# Patient Record
Sex: Female | Born: 1940 | Race: Black or African American | Hispanic: No | Smoking: Never smoker
Health system: Southern US, Community
[De-identification: ages and names within clinical notes are randomized; demographics above are authoritative.]

## PROBLEM LIST (undated history)

## (undated) DIAGNOSIS — N189 Chronic kidney disease, unspecified: Secondary | ICD-10-CM

## (undated) DIAGNOSIS — E039 Hypothyroidism, unspecified: Secondary | ICD-10-CM

## (undated) DIAGNOSIS — M1990 Unspecified osteoarthritis, unspecified site: Secondary | ICD-10-CM

## (undated) DIAGNOSIS — I10 Essential (primary) hypertension: Secondary | ICD-10-CM

## (undated) DIAGNOSIS — E7800 Pure hypercholesterolemia, unspecified: Secondary | ICD-10-CM

## (undated) HISTORY — PX: NO PAST SURGERIES: SHX2092

---

## 2015-06-24 DIAGNOSIS — Z789 Other specified health status: Secondary | ICD-10-CM | POA: Diagnosis not present

## 2015-06-24 DIAGNOSIS — I10 Essential (primary) hypertension: Secondary | ICD-10-CM | POA: Diagnosis not present

## 2015-06-24 DIAGNOSIS — J069 Acute upper respiratory infection, unspecified: Secondary | ICD-10-CM | POA: Diagnosis not present

## 2015-06-24 DIAGNOSIS — N184 Chronic kidney disease, stage 4 (severe): Secondary | ICD-10-CM | POA: Diagnosis not present

## 2015-08-27 DIAGNOSIS — R5383 Other fatigue: Secondary | ICD-10-CM | POA: Diagnosis not present

## 2015-08-27 DIAGNOSIS — Z1211 Encounter for screening for malignant neoplasm of colon: Secondary | ICD-10-CM | POA: Diagnosis not present

## 2015-08-27 DIAGNOSIS — E7800 Pure hypercholesterolemia, unspecified: Secondary | ICD-10-CM | POA: Diagnosis not present

## 2015-08-27 DIAGNOSIS — Z7189 Other specified counseling: Secondary | ICD-10-CM | POA: Diagnosis not present

## 2015-08-27 DIAGNOSIS — Z1389 Encounter for screening for other disorder: Secondary | ICD-10-CM | POA: Diagnosis not present

## 2015-08-27 DIAGNOSIS — Z299 Encounter for prophylactic measures, unspecified: Secondary | ICD-10-CM | POA: Diagnosis not present

## 2015-08-27 DIAGNOSIS — Z79899 Other long term (current) drug therapy: Secondary | ICD-10-CM | POA: Diagnosis not present

## 2015-08-27 DIAGNOSIS — Z0000 Encounter for general adult medical examination without abnormal findings: Secondary | ICD-10-CM | POA: Diagnosis not present

## 2015-09-22 DIAGNOSIS — E7800 Pure hypercholesterolemia, unspecified: Secondary | ICD-10-CM | POA: Diagnosis not present

## 2015-09-22 DIAGNOSIS — I10 Essential (primary) hypertension: Secondary | ICD-10-CM | POA: Diagnosis not present

## 2015-10-15 DIAGNOSIS — R809 Proteinuria, unspecified: Secondary | ICD-10-CM | POA: Diagnosis not present

## 2015-10-15 DIAGNOSIS — Z79899 Other long term (current) drug therapy: Secondary | ICD-10-CM | POA: Diagnosis not present

## 2015-10-15 DIAGNOSIS — I10 Essential (primary) hypertension: Secondary | ICD-10-CM | POA: Diagnosis not present

## 2015-10-15 DIAGNOSIS — D509 Iron deficiency anemia, unspecified: Secondary | ICD-10-CM | POA: Diagnosis not present

## 2015-10-15 DIAGNOSIS — N183 Chronic kidney disease, stage 3 (moderate): Secondary | ICD-10-CM | POA: Diagnosis not present

## 2015-10-15 DIAGNOSIS — E559 Vitamin D deficiency, unspecified: Secondary | ICD-10-CM | POA: Diagnosis not present

## 2015-10-23 DIAGNOSIS — E7800 Pure hypercholesterolemia, unspecified: Secondary | ICD-10-CM | POA: Diagnosis not present

## 2015-10-23 DIAGNOSIS — I10 Essential (primary) hypertension: Secondary | ICD-10-CM | POA: Diagnosis not present

## 2015-10-28 DIAGNOSIS — I10 Essential (primary) hypertension: Secondary | ICD-10-CM | POA: Diagnosis not present

## 2015-10-28 DIAGNOSIS — D509 Iron deficiency anemia, unspecified: Secondary | ICD-10-CM | POA: Diagnosis not present

## 2015-10-28 DIAGNOSIS — R809 Proteinuria, unspecified: Secondary | ICD-10-CM | POA: Diagnosis not present

## 2015-10-28 DIAGNOSIS — N183 Chronic kidney disease, stage 3 (moderate): Secondary | ICD-10-CM | POA: Diagnosis not present

## 2015-12-04 DIAGNOSIS — Z789 Other specified health status: Secondary | ICD-10-CM | POA: Diagnosis not present

## 2015-12-04 DIAGNOSIS — J029 Acute pharyngitis, unspecified: Secondary | ICD-10-CM | POA: Diagnosis not present

## 2015-12-17 DIAGNOSIS — I10 Essential (primary) hypertension: Secondary | ICD-10-CM | POA: Diagnosis not present

## 2015-12-17 DIAGNOSIS — E7800 Pure hypercholesterolemia, unspecified: Secondary | ICD-10-CM | POA: Diagnosis not present

## 2016-01-13 DIAGNOSIS — E785 Hyperlipidemia, unspecified: Secondary | ICD-10-CM | POA: Diagnosis not present

## 2016-01-13 DIAGNOSIS — M1611 Unilateral primary osteoarthritis, right hip: Secondary | ICD-10-CM | POA: Diagnosis not present

## 2016-01-13 DIAGNOSIS — N184 Chronic kidney disease, stage 4 (severe): Secondary | ICD-10-CM | POA: Diagnosis not present

## 2016-01-13 DIAGNOSIS — M79651 Pain in right thigh: Secondary | ICD-10-CM | POA: Diagnosis not present

## 2016-01-13 DIAGNOSIS — M25551 Pain in right hip: Secondary | ICD-10-CM | POA: Diagnosis not present

## 2016-02-02 DIAGNOSIS — I10 Essential (primary) hypertension: Secondary | ICD-10-CM | POA: Diagnosis not present

## 2016-02-02 DIAGNOSIS — E7800 Pure hypercholesterolemia, unspecified: Secondary | ICD-10-CM | POA: Diagnosis not present

## 2016-03-16 DIAGNOSIS — E7800 Pure hypercholesterolemia, unspecified: Secondary | ICD-10-CM | POA: Diagnosis not present

## 2016-03-16 DIAGNOSIS — I10 Essential (primary) hypertension: Secondary | ICD-10-CM | POA: Diagnosis not present

## 2016-04-21 DIAGNOSIS — Z79899 Other long term (current) drug therapy: Secondary | ICD-10-CM | POA: Diagnosis not present

## 2016-04-21 DIAGNOSIS — N183 Chronic kidney disease, stage 3 (moderate): Secondary | ICD-10-CM | POA: Diagnosis not present

## 2016-04-21 DIAGNOSIS — E559 Vitamin D deficiency, unspecified: Secondary | ICD-10-CM | POA: Diagnosis not present

## 2016-04-21 DIAGNOSIS — R809 Proteinuria, unspecified: Secondary | ICD-10-CM | POA: Diagnosis not present

## 2016-04-21 DIAGNOSIS — D509 Iron deficiency anemia, unspecified: Secondary | ICD-10-CM | POA: Diagnosis not present

## 2016-04-21 DIAGNOSIS — I10 Essential (primary) hypertension: Secondary | ICD-10-CM | POA: Diagnosis not present

## 2016-04-27 DIAGNOSIS — D649 Anemia, unspecified: Secondary | ICD-10-CM | POA: Diagnosis not present

## 2016-04-27 DIAGNOSIS — N250 Renal osteodystrophy: Secondary | ICD-10-CM | POA: Diagnosis not present

## 2016-04-27 DIAGNOSIS — R809 Proteinuria, unspecified: Secondary | ICD-10-CM | POA: Diagnosis not present

## 2016-04-27 DIAGNOSIS — N183 Chronic kidney disease, stage 3 (moderate): Secondary | ICD-10-CM | POA: Diagnosis not present

## 2016-04-27 DIAGNOSIS — I10 Essential (primary) hypertension: Secondary | ICD-10-CM | POA: Diagnosis not present

## 2016-04-29 DIAGNOSIS — I10 Essential (primary) hypertension: Secondary | ICD-10-CM | POA: Diagnosis not present

## 2016-04-29 DIAGNOSIS — E7800 Pure hypercholesterolemia, unspecified: Secondary | ICD-10-CM | POA: Diagnosis not present

## 2016-10-20 DIAGNOSIS — N183 Chronic kidney disease, stage 3 (moderate): Secondary | ICD-10-CM | POA: Diagnosis not present

## 2016-10-20 DIAGNOSIS — I10 Essential (primary) hypertension: Secondary | ICD-10-CM | POA: Diagnosis not present

## 2016-10-20 DIAGNOSIS — D649 Anemia, unspecified: Secondary | ICD-10-CM | POA: Diagnosis not present

## 2016-10-20 DIAGNOSIS — R809 Proteinuria, unspecified: Secondary | ICD-10-CM | POA: Diagnosis not present

## 2016-10-20 DIAGNOSIS — E559 Vitamin D deficiency, unspecified: Secondary | ICD-10-CM | POA: Diagnosis not present

## 2016-10-20 DIAGNOSIS — Z79899 Other long term (current) drug therapy: Secondary | ICD-10-CM | POA: Diagnosis not present

## 2017-07-15 ENCOUNTER — Encounter: Payer: Self-pay | Admitting: *Deleted

## 2017-07-18 ENCOUNTER — Telehealth: Payer: Self-pay | Admitting: Cardiology

## 2017-07-18 ENCOUNTER — Encounter: Payer: Self-pay | Admitting: Cardiology

## 2017-07-18 ENCOUNTER — Encounter: Payer: Self-pay | Admitting: *Deleted

## 2017-07-18 ENCOUNTER — Ambulatory Visit: Payer: Medicare HMO | Admitting: Cardiology

## 2017-07-18 VITALS — BP 132/78 | HR 71 | Ht 63.0 in | Wt 155.6 lb

## 2017-07-18 DIAGNOSIS — R0600 Dyspnea, unspecified: Secondary | ICD-10-CM | POA: Diagnosis not present

## 2017-07-18 DIAGNOSIS — R0602 Shortness of breath: Secondary | ICD-10-CM | POA: Diagnosis not present

## 2017-07-18 NOTE — Telephone Encounter (Signed)
Pre-cert Verification for the following procedure   Echo scheduled for 08/11/2017

## 2017-07-18 NOTE — Patient Instructions (Signed)
Your physician recommends that you schedule a follow-up appointment in: TO BE DETERMINED AFTER TESTING WITH DR BRANCH  Your physician recommends that you continue on your current medications as directed. Please refer to the Current Medication list given to you today.  Your physician has requested that you have an echocardiogram. Echocardiography is a painless test that uses sound waves to create images of your heart. It provides your doctor with information about the size and shape of your heart and how well your heart's chambers and valves are working. This procedure takes approximately one hour. There are no restrictions for this procedure.  Thank you for choosing [facility]!!    

## 2017-07-18 NOTE — Progress Notes (Signed)
Clinical Summary Ms. Hallisey is a 77 y.o.female seen as new consult, referred by Dr Woody Seller for dyspnea.   1. Dyspnea - DOE with walking and housework. Started about 3 years ago. Some recent increase - mild chest pain at times. Rare, at rest or with exertion. No recent edema. No orthopnea - DOE just walking room. Occaional cough that has improved but SOB not improved. No wheezing - no palpitations.   CAD risk factors: HTN, HL     No past medical history on file.   Allergies  Allergen Reactions  . Codeine     NAUSEA   . Diclofenac     BURNING STOMACH      Current Outpatient Medications  Medication Sig Dispense Refill  . amLODipine (NORVASC) 5 MG tablet Take 5 mg by mouth daily.    Marland Kitchen aspirin EC 81 MG tablet Take 81 mg by mouth daily.    . ferrous sulfate 325 (65 FE) MG tablet Take 325 mg by mouth daily with breakfast.    . fluticasone (FLONASE) 50 MCG/ACT nasal spray Place into both nostrils daily.    Marland Kitchen levothyroxine (SYNTHROID, LEVOTHROID) 50 MCG tablet Take 50 mcg by mouth daily before breakfast.    . lisinopril-hydrochlorothiazide (PRINZIDE,ZESTORETIC) 20-12.5 MG tablet Take 1 tablet by mouth daily.    . meclizine (ANTIVERT) 25 MG tablet Take 25 mg by mouth 3 (three) times daily as needed for dizziness.    . Omega-3 Fatty Acids (OMEGA 3 500 PO) Take by mouth.    . pantoprazole (PROTONIX) 40 MG tablet Take 40 mg by mouth daily.    . simvastatin (ZOCOR) 40 MG tablet Take 40 mg by mouth daily.    . tamsulosin (FLOMAX) 0.4 MG CAPS capsule Take 0.4 mg by mouth.    . vitamin B-12 (CYANOCOBALAMIN) 1000 MCG tablet Take 1,000 mcg by mouth daily.     No current facility-administered medications for this visit.         Allergies  Allergen Reactions  . Codeine     NAUSEA   . Diclofenac     BURNING STOMACH       No family history on file.   Social History Ms. Granito has no tobacco history on file. Ms. Baiza has no alcohol history on file.   Review of  Systems CONSTITUTIONAL: No weight loss, fever, chills, weakness or fatigue.  HEENT: Eyes: No visual loss, blurred vision, double vision or yellow sclerae.No hearing loss, sneezing, congestion, runny nose or sore throat.  SKIN: No rash or itching.  CARDIOVASCULAR: per hpi RESPIRATORY: per hpi GASTROINTESTINAL: No anorexia, nausea, vomiting or diarrhea. No abdominal pain or blood.  GENITOURINARY: No burning on urination, no polyuria NEUROLOGICAL: No headache, dizziness, syncope, paralysis, ataxia, numbness or tingling in the extremities. No change in bowel or bladder control.  MUSCULOSKELETAL: No muscle, back pain, joint pain or stiffness.  LYMPHATICS: No enlarged nodes. No history of splenectomy.  PSYCHIATRIC: No history of depression or anxiety.  ENDOCRINOLOGIC: No reports of sweating, cold or heat intolerance. No polyuria or polydipsia.  Marland Kitchen   Physical Examination Vitals:   07/18/17 0851  BP: 132/78  Pulse: 71  SpO2: 96%   Vitals:   07/18/17 0851  Weight: 155 lb 9.6 oz (70.6 kg)  Height: 5\' 3"  (1.6 m)    Gen: resting comfortably, no acute distress HEENT: no scleral icterus, pupils equal round and reactive, no palptable cervical adenopathy,  CV: RRR, no m/r/g, no jvd Resp: Clear to auscultation bilaterally GI:  abdomen is soft, non-tender, non-distended, normal bowel sounds, no hepatosplenomegaly MSK: extremities are warm, no edema.  Skin: warm, no rash Neuro:  no focal deficits Psych: appropriate affect     Assessment and Plan  1. Dyspnea - unclear etiology. Baseline EKG SR without acute ischemic changes - we will obtain an echo to evaluate for underlying cardiac dysfunction as the etiology. Pending results consider a lexiscan for ischemic evaluation    F/u pending test results      Arnoldo Lenis, M.D.

## 2017-07-23 ENCOUNTER — Encounter: Payer: Self-pay | Admitting: Cardiology

## 2017-08-11 ENCOUNTER — Ambulatory Visit (INDEPENDENT_AMBULATORY_CARE_PROVIDER_SITE_OTHER): Payer: Medicare HMO

## 2017-08-11 ENCOUNTER — Other Ambulatory Visit: Payer: Medicare HMO

## 2017-08-11 ENCOUNTER — Other Ambulatory Visit: Payer: Self-pay

## 2017-08-11 DIAGNOSIS — R0602 Shortness of breath: Secondary | ICD-10-CM

## 2017-08-17 ENCOUNTER — Telehealth: Payer: Self-pay | Admitting: *Deleted

## 2017-08-17 NOTE — Telephone Encounter (Signed)
Pt aware and declined stress test. Says she hasn't had any SOB or any other symptoms for the last week. Will forward to pcp and provider on when pt needs f/u

## 2017-08-17 NOTE — Telephone Encounter (Signed)
-----   Message from Arnoldo Lenis, MD sent at 08/15/2017 12:33 PM EDT ----- Echo looks good, normal heart function. Please order a lexiscan to evalaute for possible blocakges as the cause of her shortness of breath. Hold norvasc day of test  Zandra Abts MD

## 2017-08-18 NOTE — Telephone Encounter (Signed)
F/u 4 months  Kathleen Abts MD

## 2017-08-18 NOTE — Telephone Encounter (Signed)
Recall placed

## 2017-10-25 DIAGNOSIS — N183 Chronic kidney disease, stage 3 (moderate): Secondary | ICD-10-CM | POA: Diagnosis not present

## 2017-10-25 DIAGNOSIS — I10 Essential (primary) hypertension: Secondary | ICD-10-CM | POA: Diagnosis not present

## 2017-10-25 DIAGNOSIS — E889 Metabolic disorder, unspecified: Secondary | ICD-10-CM | POA: Diagnosis not present

## 2017-10-25 DIAGNOSIS — D649 Anemia, unspecified: Secondary | ICD-10-CM | POA: Diagnosis not present

## 2017-10-25 DIAGNOSIS — M9080 Osteopathy in diseases classified elsewhere, unspecified site: Secondary | ICD-10-CM | POA: Diagnosis not present

## 2017-12-06 ENCOUNTER — Ambulatory Visit (HOSPITAL_COMMUNITY)
Admission: RE | Admit: 2017-12-06 | Discharge: 2017-12-06 | Disposition: A | Discharge status: Home / Self Care | Payer: Medicare HMO | Source: Ambulatory Visit | Attending: Nurse Practitioner | Admitting: Nurse Practitioner

## 2017-12-06 ENCOUNTER — Other Ambulatory Visit (HOSPITAL_COMMUNITY): Payer: Self-pay | Admitting: Nurse Practitioner

## 2017-12-06 DIAGNOSIS — Z6829 Body mass index (BMI) 29.0-29.9, adult: Secondary | ICD-10-CM | POA: Diagnosis not present

## 2017-12-06 DIAGNOSIS — N184 Chronic kidney disease, stage 4 (severe): Secondary | ICD-10-CM | POA: Diagnosis not present

## 2017-12-06 DIAGNOSIS — R600 Localized edema: Secondary | ICD-10-CM | POA: Diagnosis not present

## 2017-12-06 DIAGNOSIS — M79604 Pain in right leg: Secondary | ICD-10-CM | POA: Diagnosis not present

## 2017-12-06 DIAGNOSIS — Z299 Encounter for prophylactic measures, unspecified: Secondary | ICD-10-CM | POA: Diagnosis not present

## 2017-12-06 DIAGNOSIS — I10 Essential (primary) hypertension: Secondary | ICD-10-CM | POA: Diagnosis not present

## 2018-01-31 DIAGNOSIS — H1131 Conjunctival hemorrhage, right eye: Secondary | ICD-10-CM | POA: Diagnosis not present

## 2018-01-31 DIAGNOSIS — M25512 Pain in left shoulder: Secondary | ICD-10-CM | POA: Diagnosis not present

## 2018-01-31 DIAGNOSIS — Z6828 Body mass index (BMI) 28.0-28.9, adult: Secondary | ICD-10-CM | POA: Diagnosis not present

## 2018-01-31 DIAGNOSIS — I10 Essential (primary) hypertension: Secondary | ICD-10-CM | POA: Diagnosis not present

## 2018-01-31 DIAGNOSIS — Z299 Encounter for prophylactic measures, unspecified: Secondary | ICD-10-CM | POA: Diagnosis not present

## 2018-02-03 DIAGNOSIS — Z299 Encounter for prophylactic measures, unspecified: Secondary | ICD-10-CM | POA: Diagnosis not present

## 2018-02-03 DIAGNOSIS — M25512 Pain in left shoulder: Secondary | ICD-10-CM | POA: Diagnosis not present

## 2018-02-03 DIAGNOSIS — Z6828 Body mass index (BMI) 28.0-28.9, adult: Secondary | ICD-10-CM | POA: Diagnosis not present

## 2018-02-03 DIAGNOSIS — M1990 Unspecified osteoarthritis, unspecified site: Secondary | ICD-10-CM | POA: Diagnosis not present

## 2018-02-03 DIAGNOSIS — R110 Nausea: Secondary | ICD-10-CM | POA: Diagnosis not present

## 2018-02-03 DIAGNOSIS — R42 Dizziness and giddiness: Secondary | ICD-10-CM | POA: Diagnosis not present

## 2018-02-03 DIAGNOSIS — I10 Essential (primary) hypertension: Secondary | ICD-10-CM | POA: Diagnosis not present

## 2018-02-27 DIAGNOSIS — H5200 Hypermetropia, unspecified eye: Secondary | ICD-10-CM | POA: Diagnosis not present

## 2018-02-27 DIAGNOSIS — I10 Essential (primary) hypertension: Secondary | ICD-10-CM | POA: Diagnosis not present

## 2018-02-27 DIAGNOSIS — Z0100 Encounter for examination of eyes and vision without abnormal findings: Secondary | ICD-10-CM | POA: Diagnosis not present

## 2018-02-27 DIAGNOSIS — H2510 Age-related nuclear cataract, unspecified eye: Secondary | ICD-10-CM | POA: Diagnosis not present

## 2018-02-27 DIAGNOSIS — H40009 Preglaucoma, unspecified, unspecified eye: Secondary | ICD-10-CM | POA: Diagnosis not present

## 2018-04-21 DIAGNOSIS — N183 Chronic kidney disease, stage 3 (moderate): Secondary | ICD-10-CM | POA: Diagnosis not present

## 2018-04-21 DIAGNOSIS — R809 Proteinuria, unspecified: Secondary | ICD-10-CM | POA: Diagnosis not present

## 2018-04-21 DIAGNOSIS — E559 Vitamin D deficiency, unspecified: Secondary | ICD-10-CM | POA: Diagnosis not present

## 2018-04-21 DIAGNOSIS — Z79899 Other long term (current) drug therapy: Secondary | ICD-10-CM | POA: Diagnosis not present

## 2018-04-21 DIAGNOSIS — I129 Hypertensive chronic kidney disease with stage 1 through stage 4 chronic kidney disease, or unspecified chronic kidney disease: Secondary | ICD-10-CM | POA: Diagnosis not present

## 2018-04-21 DIAGNOSIS — D649 Anemia, unspecified: Secondary | ICD-10-CM | POA: Diagnosis not present

## 2018-05-09 DIAGNOSIS — D509 Iron deficiency anemia, unspecified: Secondary | ICD-10-CM | POA: Diagnosis not present

## 2018-05-09 DIAGNOSIS — N183 Chronic kidney disease, stage 3 (moderate): Secondary | ICD-10-CM | POA: Diagnosis not present

## 2018-05-09 DIAGNOSIS — E876 Hypokalemia: Secondary | ICD-10-CM | POA: Diagnosis not present

## 2018-05-09 DIAGNOSIS — I10 Essential (primary) hypertension: Secondary | ICD-10-CM | POA: Diagnosis not present

## 2018-09-05 ENCOUNTER — Other Ambulatory Visit (HOSPITAL_COMMUNITY): Payer: Medicare HMO

## 2018-09-08 ENCOUNTER — Ambulatory Visit: Admit: 2018-09-08 | Payer: Medicare HMO | Admitting: Ophthalmology

## 2018-09-08 SURGERY — PHACOEMULSIFICATION, CATARACT, WITH IOL INSERTION
Anesthesia: Monitor Anesthesia Care | Laterality: Right

## 2018-09-18 ENCOUNTER — Other Ambulatory Visit (HOSPITAL_COMMUNITY): Payer: Medicare HMO

## 2018-09-22 ENCOUNTER — Ambulatory Visit: Admit: 2018-09-22 | Payer: Medicare HMO | Admitting: Ophthalmology

## 2018-09-22 SURGERY — PHACOEMULSIFICATION, CATARACT, WITH IOL INSERTION
Anesthesia: Monitor Anesthesia Care | Laterality: Left

## 2018-11-02 ENCOUNTER — Other Ambulatory Visit: Payer: Self-pay

## 2018-11-02 ENCOUNTER — Encounter (HOSPITAL_COMMUNITY): Payer: Self-pay

## 2018-11-03 ENCOUNTER — Encounter (HOSPITAL_COMMUNITY)
Admission: RE | Admit: 2018-11-03 | Discharge: 2018-11-03 | Disposition: A | Discharge status: Home / Self Care | Payer: Medicare Other | Source: Ambulatory Visit | Attending: Ophthalmology | Admitting: Ophthalmology

## 2018-11-07 ENCOUNTER — Other Ambulatory Visit (HOSPITAL_COMMUNITY)
Admission: RE | Admit: 2018-11-07 | Discharge: 2018-11-07 | Disposition: A | Discharge status: Home / Self Care | Payer: Medicare Other | Source: Ambulatory Visit | Attending: Ophthalmology | Admitting: Ophthalmology

## 2018-11-07 ENCOUNTER — Other Ambulatory Visit: Payer: Self-pay

## 2018-11-07 DIAGNOSIS — Z1159 Encounter for screening for other viral diseases: Secondary | ICD-10-CM | POA: Insufficient documentation

## 2018-11-08 LAB — NOVEL CORONAVIRUS, NAA (HOSP ORDER, SEND-OUT TO REF LAB; TAT 18-24 HRS): SARS-CoV-2, NAA: NOT DETECTED

## 2018-11-09 MED ORDER — LIDOCAINE HCL 3.5 % OP GEL
OPHTHALMIC | Status: AC
  Filled 2018-11-09: qty 1

## 2018-11-09 MED ORDER — TETRACAINE HCL 0.5 % OP SOLN
OPHTHALMIC | Status: AC
  Filled 2018-11-09: qty 4

## 2018-11-09 MED ORDER — LIDOCAINE HCL (PF) 1 % IJ SOLN
INTRAMUSCULAR | Status: AC
  Filled 2018-11-09: qty 2

## 2018-11-09 MED ORDER — CYCLOPENTOLATE-PHENYLEPHRINE 0.2-1 % OP SOLN
OPHTHALMIC | Status: AC
  Filled 2018-11-09: qty 2

## 2018-11-09 MED ORDER — NEOMYCIN-POLYMYXIN-DEXAMETH 3.5-10000-0.1 OP SUSP
OPHTHALMIC | Status: AC
  Filled 2018-11-09: qty 5

## 2018-11-09 MED ORDER — PHENYLEPHRINE HCL 2.5 % OP SOLN
OPHTHALMIC | Status: AC
  Filled 2018-11-09: qty 15

## 2018-11-10 ENCOUNTER — Ambulatory Visit (HOSPITAL_COMMUNITY)
Admission: RE | Admit: 2018-11-10 | Discharge: 2018-11-10 | Disposition: A | Discharge status: Home / Self Care | Payer: Medicare Other | Attending: Ophthalmology | Admitting: Ophthalmology

## 2018-11-10 ENCOUNTER — Encounter (HOSPITAL_COMMUNITY): Payer: Self-pay | Admitting: *Deleted

## 2018-11-10 ENCOUNTER — Other Ambulatory Visit: Payer: Self-pay

## 2018-11-10 ENCOUNTER — Ambulatory Visit (HOSPITAL_COMMUNITY): Payer: Medicare Other | Admitting: Anesthesiology

## 2018-11-10 ENCOUNTER — Encounter (HOSPITAL_COMMUNITY)
Admission: RE | Disposition: A | Discharge status: Home / Self Care | Payer: Self-pay | Source: Home / Self Care | Attending: Ophthalmology

## 2018-11-10 DIAGNOSIS — H259 Unspecified age-related cataract: Secondary | ICD-10-CM | POA: Insufficient documentation

## 2018-11-10 DIAGNOSIS — M1990 Unspecified osteoarthritis, unspecified site: Secondary | ICD-10-CM | POA: Diagnosis not present

## 2018-11-10 DIAGNOSIS — I10 Essential (primary) hypertension: Secondary | ICD-10-CM | POA: Insufficient documentation

## 2018-11-10 DIAGNOSIS — E039 Hypothyroidism, unspecified: Secondary | ICD-10-CM | POA: Insufficient documentation

## 2018-11-10 DIAGNOSIS — E7800 Pure hypercholesterolemia, unspecified: Secondary | ICD-10-CM | POA: Insufficient documentation

## 2018-11-10 DIAGNOSIS — Z79899 Other long term (current) drug therapy: Secondary | ICD-10-CM | POA: Insufficient documentation

## 2018-11-10 DIAGNOSIS — R0602 Shortness of breath: Secondary | ICD-10-CM | POA: Insufficient documentation

## 2018-11-10 HISTORY — DX: Pure hypercholesterolemia, unspecified: E78.00

## 2018-11-10 HISTORY — DX: Chronic kidney disease, unspecified: N18.9

## 2018-11-10 HISTORY — DX: Hypothyroidism, unspecified: E03.9

## 2018-11-10 HISTORY — DX: Unspecified osteoarthritis, unspecified site: M19.90

## 2018-11-10 HISTORY — DX: Essential (primary) hypertension: I10

## 2018-11-10 HISTORY — PX: CATARACT EXTRACTION W/PHACO: SHX586

## 2018-11-10 SURGERY — PHACOEMULSIFICATION, CATARACT, WITH IOL INSERTION
Anesthesia: Monitor Anesthesia Care | Site: Eye | Laterality: Right

## 2018-11-10 MED ORDER — PHENYLEPHRINE HCL 2.5 % OP SOLN
1.0000 [drp] | OPHTHALMIC | Status: AC
  Administered 2018-11-10 (×3): 1 [drp] via OPHTHALMIC

## 2018-11-10 MED ORDER — EPINEPHRINE PF 1 MG/ML IJ SOLN
INTRAOCULAR | Status: DC | PRN
  Administered 2018-11-10: 500 mL

## 2018-11-10 MED ORDER — PROVISC 10 MG/ML IO SOLN
INTRAOCULAR | Status: DC | PRN
  Administered 2018-11-10: 0.85 mL via INTRAOCULAR

## 2018-11-10 MED ORDER — POVIDONE-IODINE 5 % OP SOLN
OPHTHALMIC | Status: DC | PRN
  Administered 2018-11-10: 1 via OPHTHALMIC

## 2018-11-10 MED ORDER — SODIUM HYALURONATE 23 MG/ML IO SOLN
INTRAOCULAR | Status: DC | PRN
  Administered 2018-11-10: 0.6 mL via INTRAOCULAR

## 2018-11-10 MED ORDER — NEOMYCIN-POLYMYXIN-DEXAMETH 3.5-10000-0.1 OP SUSP
OPHTHALMIC | Status: DC | PRN
  Administered 2018-11-10: 2 [drp] via OPHTHALMIC

## 2018-11-10 MED ORDER — TETRACAINE HCL 0.5 % OP SOLN
1.0000 [drp] | OPHTHALMIC | Status: AC
  Administered 2018-11-10 (×3): 1 [drp] via OPHTHALMIC

## 2018-11-10 MED ORDER — CYCLOPENTOLATE-PHENYLEPHRINE 0.2-1 % OP SOLN
1.0000 [drp] | OPHTHALMIC | Status: AC
  Administered 2018-11-10 (×3): 1 [drp] via OPHTHALMIC

## 2018-11-10 MED ORDER — LIDOCAINE HCL 3.5 % OP GEL: 1.0000 "application " | Freq: Once | OPHTHALMIC | Status: DC

## 2018-11-10 MED ORDER — LIDOCAINE HCL (PF) 1 % IJ SOLN
INTRAOCULAR | Status: DC | PRN
  Administered 2018-11-10: 1 mL via OPHTHALMIC

## 2018-11-10 MED ORDER — BSS IO SOLN
INTRAOCULAR | Status: DC | PRN
  Administered 2018-11-10: 15 mL

## 2018-11-10 SURGICAL SUPPLY — 12 items

## 2018-11-10 NOTE — Transfer of Care (Signed)
Immediate Anesthesia Transfer of Care Note  Patient: Kathleen Nichols  Procedure(s) Performed: CATARACT EXTRACTION PHACO AND INTRAOCULAR LENS PLACEMENT (IOC) (Right Eye)  Patient Location: Short Stay  Anesthesia Type:MAC  Level of Consciousness: awake and patient cooperative  Airway & Oxygen Therapy: Patient Spontanous Breathing  Post-op Assessment: Report given to RN, Post -op Vital signs reviewed and stable and Patient moving all extremities  Post vital signs: Reviewed and stable  Last Vitals:  Vitals Value Taken Time  BP    Temp    Pulse    Resp    SpO2      Last Pain:  Vitals:   11/10/18 0705  TempSrc: Oral  PainSc: 0-No pain      Patients Stated Pain Goal: 5 (82/[id number])  Complications: No apparent anesthesia complications

## 2018-11-10 NOTE — Discharge Instructions (Signed)
Please discharge patient when stable, will follow up today with Dr. Kaileena Obi at the [facility] office immediately following discharge.  Leave shield in place until visit.  All paperwork with discharge instructions will be given at the office. ° °

## 2018-11-10 NOTE — Op Note (Signed)
Date of procedure: 11/10/18  Pre-operative diagnosis: Visually significant age-related cataract, Right Eye (H25.811)  Post-operative diagnosis: Visually significant age-related cataract, Right Eye  Procedure: Removal of cataract via phacoemulsification and insertion of intra-ocular lens [employer] PCB00  +24.0D into the capsular bag of the Right Eye  Attending surgeon: Gerda Diss. Areona [place], MD, MA  Anesthesia: MAC, Topical Akten  Complications: None  Estimated Blood Loss: <46m (minimal)  Specimens: None  Implants: As above  Indications:  Visually significant age-related cataract, Right Eye  Procedure:  The patient was seen and identified in the pre-operative area. The operative eye was identified and dilated.  The operative eye was marked.  Topical anesthesia was administered to the operative eye.     The patient was then to the operative suite and placed in the supine position.  A timeout was performed confirming the patient, procedure to be performed, and all other relevant information.   The patient's face was prepped and draped in the usual fashion for intra-ocular surgery.  A lid speculum was placed into the operative eye and the surgical microscope moved into place and focused.  A superotemporal paracentesis was created using a 20 gauge paracentesis blade.  Shugarcaine was injected into the anterior chamber.  Viscoelastic was injected into the anterior chamber.  A temporal clear-corneal main wound incision was created using a 2.465mmicrokeratome.  A continuous curvilinear capsulorrhexis was initiated using an irrigating cystitome and completed using capsulorrhexis forceps.  Hydrodissection and hydrodeliniation were performed.  Viscoelastic was injected into the anterior chamber.  A phacoemulsification handpiece and a chopper as a second instrument were used to remove the nucleus and epinucleus. The irrigation/aspiration handpiece was used to remove any remaining cortical  material.   The capsular bag was reinflated with viscoelastic, checked, and found to be intact.  The intraocular lens was inserted into the capsular bag and dialed into place using a Kuglen hook.  The irrigation/aspiration handpiece was used to remove any remaining viscoelastic.  The clear corneal wound and paracentesis wounds were then hydrated and checked with Weck-Cels to be watertight.  The lid-speculum and drape was removed, and the patient's face was cleaned with a wet and dry 4x4.  Maxitrol was instilled in the eye before a clear shield was taped over the eye. The patient was taken to the post-operative care unit in good condition, having tolerated the procedure well.  Post-Op Instructions: [facility] and will receive all other orders and instructions.

## 2018-11-10 NOTE — H&P (Signed)
The H and P was reviewed and updated. The patient was examined.  No changes were found after exam.  The surgical eye was marked.  

## 2018-11-10 NOTE — Anesthesia Preprocedure Evaluation (Signed)
Anesthesia Evaluation  Patient identified by MRN, date of birth, ID band Patient awake    Reviewed: Allergy & Precautions, NPO status , Patient's Chart, lab work & pertinent test results, reviewed documented beta blocker date and time   Airway Mallampati: III  TM Distance: >3 FB Neck ROM: Full    Dental no notable dental hx. (+) Teeth Intact   Pulmonary shortness of breath and with exertion,  Denies any breathing meds or smoking Denies COPD   Pulmonary exam normal breath sounds clear to auscultation       Cardiovascular Exercise Tolerance: Good hypertension, Pt. on medications negative cardio ROS Normal cardiovascular examI Rhythm:Regular Rate:Normal     Neuro/Psych negative neurological ROS  negative psych ROS   GI/Hepatic negative GI ROS, Neg liver ROS,   Endo/Other  Hypothyroidism   Renal/GU Renal InsufficiencyRenal disease  negative genitourinary   Musculoskeletal  (+) Arthritis , Osteoarthritis,    Abdominal   Peds negative pediatric ROS (+)  Hematology negative hematology ROS (+)   Anesthesia Other Findings   Reproductive/Obstetrics negative OB ROS                             Anesthesia Physical Anesthesia Plan  ASA: II  Anesthesia Plan: MAC   Post-op Pain Management:    Induction: Intravenous  PONV Risk Score and Plan: 2 and Treatment may vary due to age or medical condition  Airway Management Planned: Nasal Cannula and Simple Face Mask  Additional Equipment:   Intra-op Plan:   Post-operative Plan: Extubation in OR  Informed Consent: I have reviewed the patients History and Physical, chart, labs and discussed the procedure including the risks, benefits and alternatives for the proposed anesthesia with the patient or authorized representative who has indicated his/her understanding and acceptance.     Dental advisory given  Plan Discussed with:  CRNA  Anesthesia Plan Comments: (Plan Full PPE use  Plan MAC -GA  PRN)        Anesthesia Quick Evaluation

## 2018-11-10 NOTE — Anesthesia Postprocedure Evaluation (Signed)
Anesthesia Post Note  Patient: Kathleen Nichols  Procedure(s) Performed: CATARACT EXTRACTION PHACO AND INTRAOCULAR LENS PLACEMENT (IOC) (Right Eye)  Patient location during evaluation: Short Stay Anesthesia Type: MAC Level of consciousness: awake and alert and patient cooperative Pain management: pain level controlled Vital Signs Assessment: post-procedure vital signs reviewed and stable Respiratory status: spontaneous breathing, nonlabored ventilation and respiratory function stable Cardiovascular status: blood pressure returned to baseline Postop Assessment: no apparent nausea or vomiting Anesthetic complications: no     Last Vitals:  Vitals:   11/10/18 0705  BP: 134/70  Pulse: 61  Resp: 16  Temp: 36.9 C  SpO2: 95%    Last Pain:  Vitals:   11/10/18 0705  TempSrc: Oral  PainSc: 0-No pain                 Porschea Borys J

## 2018-11-10 NOTE — Progress Notes (Signed)
Patient waiting on her daughter to come from [place]. Will go over discharge with daughter.

## 2018-11-13 ENCOUNTER — Encounter (HOSPITAL_COMMUNITY): Payer: Self-pay | Admitting: Ophthalmology

## 2018-11-23 NOTE — Patient Instructions (Signed)
Your procedure is scheduled on:   12/04/2018              Report to [facility] at  8:15   AM.  Call this number if you have problems the morning of surgery: [phone]   Remember:   Do not eat or drink :After Midnight.    Take these medicines the morning of surgery with A SIP OF WATER:    Amlodipine, Synthroid and Metoprolol        Do not wear jewelry, make-up or nail polish.  Do not wear lotions, powders, or perfumes. You may wear deodorant.  Do not bring valuables to the hospital.  Contacts, dentures or bridgework may not be worn into surgery.  Patients discharged the day of surgery will not be allowed to drive home.  Name and phone number of your driver.                                                                                                                                       Cataract Surgery  A cataract is a clouding of the lens of the eye. When a lens becomes cloudy, vision is reduced based on the degree and nature of the clouding. Surgery may be needed to improve vision. Surgery removes the cloudy lens and usually replaces it with a substitute lens (intraocular lens, IOL). LET YOUR EYE DOCTOR KNOW ABOUT:  Allergies to food or medicine.   Medicines taken including herbs, eyedrops, over-the-counter medicines, and creams.   Use of steroids (by mouth or creams).   Previous problems with anesthetics or numbing medicine.   History of bleeding problems or blood clots.   Previous surgery.   Other health problems, including diabetes and kidney problems.   Possibility of pregnancy, if this applies.  RISKS AND COMPLICATIONS  Infection.   Inflammation of the eyeball (endophthalmitis) that can spread to both eyes (sympathetic ophthalmia).   Poor wound healing.   If an IOL is inserted, it can later fall out of proper position. This is very uncommon.   Clouding of the part of your eye that holds an IOL in place. This is called an "after-cataract." These are uncommon,  but easily treated.  BEFORE THE PROCEDURE  Do not eat or drink anything except small amounts of water for 8 to 12 before your surgery, or as directed by your caregiver.    Unless you are told otherwise, continue any eyedrops you have been prescribed.   Talk to your primary caregiver about all other medicines that you take (both prescription and non-prescription). In some cases, you may need to stop or change medicines near the time of your surgery. This is most important if you are taking blood-thinning medicine. Do not stop medicines unless you are told to do so.   Arrange for someone to drive you to and from the procedure.   Do not put contact lenses in  either eye on the day of your surgery.  PROCEDURE There is more than one method for safely removing a cataract. Your doctor can explain the differences and help determine which is best for you. Phacoemulsification surgery is the most common form of cataract surgery.  An injection is given behind the eye or eyedrops are given to make this a painless procedure.   A small cut (incision) is made on the edge of the clear, dome-shaped surface that covers the front of the eye (cornea).   A tiny probe is painlessly inserted into the eye. This device gives off ultrasound waves that soften and break up the cloudy center of the lens. This makes it easier for the cloudy lens to be removed by suction.   An IOL may be implanted.   The normal lens of the eye is covered by a clear capsule. Part of that capsule is intentionally left in the eye to support the IOL.   Your surgeon may or may not use stitches to close the incision.  There are other forms of cataract surgery that require a larger incision and stiches to close the eye. This approach is taken in cases where the doctor feels that the cataract cannot be easily removed using phacoemulsification. AFTER THE PROCEDURE  When an IOL is implanted, it does not need care. It becomes a permanent part of  your eye and cannot be seen or felt.   Your doctor will schedule follow-up exams to check on your progress.   Review your other medicines with your doctor to see which can be resumed after surgery.   Use eyedrops or take medicine as prescribed by your doctor.  Document Released: 04/29/2011 Document Reviewed: 04/26/2011 [facility] Patient Information 2012 [place].  .Cataract Surgery Care After Refer to this sheet in the next few weeks. These instructions provide you with information on caring for yourself after your procedure. Your caregiver may also give you more specific instructions. Your treatment has been planned according to current medical practices, but problems sometimes occur. Call your caregiver if you have any problems or questions after your procedure.  HOME CARE INSTRUCTIONS   Avoid strenuous activities as directed by your caregiver.   Ask your caregiver when you can resume driving.   Use eyedrops or other medicines to help healing and control pressure inside your eye as directed by your caregiver.   Only take over-the-counter or prescription medicines for pain, discomfort, or fever as directed by your caregiver.   Do not to touch or rub your eyes.   You may be instructed to use a protective shield during the first few days and nights after surgery. If not, wear sunglasses to protect your eyes. This is to protect the eye from pressure or from being accidentally bumped.   Keep the area around your eye clean and dry. Avoid swimming or allowing water to hit you directly in the face while showering. Keep soap and shampoo out of your eyes.   Do not bend or lift heavy objects. Bending increases pressure in the eye. You can walk, climb stairs, and do light household chores.   Do not put a contact lens into the eye that had surgery until your caregiver says it is okay to do so.   Ask your doctor when you can return to work. This will depend on the kind of work that you do.  If you work in a dusty environment, you may be advised to wear protective eyewear for a period of  time.   Ask your caregiver when it will be safe to engage in sexual activity.   Continue with your regular eye exams as directed by your caregiver.  What to expect:  It is normal to feel itching and mild discomfort for a few days after cataract surgery. Some fluid discharge is also common, and your eye may be sensitive to light and touch.   After 1 to 2 days, even moderate discomfort should disappear. In most cases, healing will take about 6 weeks.   If you received an intraocular lens (IOL), you may notice that colors are very bright or have a blue tinge. Also, if you have been in bright sunlight, everything may appear reddish for a few hours. If you see these color tinges, it is because your lens is clear and no longer cloudy. Within a few months after receiving an IOL, these extra colors should go away. When you have healed, you will probably need new glasses.  SEEK MEDICAL CARE IF:   You have increased bruising around your eye.   You have discomfort not helped by medicine.  SEEK IMMEDIATE MEDICAL CARE IF:   You have a  fever.   You have a worsening or sudden vision loss.   You have redness, swelling, or increasing pain in the eye.   You have a thick discharge from the eye that had surgery.  MAKE SURE YOU:  Understand these instructions.   Will watch your condition.   Will get help right away if you are not doing well or get worse.  Document Released: 11/27/2004 Document Revised: 04/29/2011 Document Reviewed: 01/01/2011 [facility] Patient Information 2012 [place].    Monitored Anesthesia Care  Monitored anesthesia care is an anesthesia service for a medical procedure. Anesthesia is the loss of the ability to feel pain. It is produced by medications called anesthetics. It may affect a small area of your body (local anesthesia), a large area of your body (regional  anesthesia), or your entire body (general anesthesia). The need for monitored anesthesia care depends your procedure, your condition, and the potential need for regional or general anesthesia. It is often provided during procedures where:   General anesthesia may be needed if there are complications. This is because you need special care when you are under general anesthesia.    You will be under local or regional anesthesia. This is so that you are able to have higher levels of anesthesia if needed.    You will receive calming medications (sedatives). This is especially the case if sedatives are given to put you in a semi-conscious state of relaxation (deep sedation). This is because the amount of sedative needed to produce this state can be hard to predict. Too much of a sedative can produce general anesthesia. Monitored anesthesia care is performed by one or more caregivers who have special training in all types of anesthesia. You will need to meet with these caregivers before your procedure. During this meeting, they will ask you about your medical history. They will also give you instructions to follow. (For example, you will need to stop eating and drinking before your procedure. You may also need to stop or change medications you are taking.) During your procedure, your caregivers will stay with you. They will:   Watch your condition. This includes watching you blood pressure, breathing, and level of pain.    Diagnose and treat problems that occur.    Give medications if they are needed. These may include calming medications (  sedatives) and anesthetics.    Make sure you are comfortable.   Having monitored anesthesia care does not necessarily mean that you will be under anesthesia. It does mean that your caregivers will be able to manage anesthesia if you need it or if it occurs. It also means that you will be able to have a different type of anesthesia than you are having if you need it. When  your procedure is complete, your caregivers will continue to watch your condition. They will make sure any medications wear off before you are allowed to go home.  Document Released: 02/03/2005 Document Revised: 09/04/2012 Document Reviewed: 06/21/2012 [facility] Patient Information 2014 [place], [place].

## 2018-11-28 ENCOUNTER — Other Ambulatory Visit: Payer: Self-pay

## 2018-11-28 ENCOUNTER — Encounter (HOSPITAL_COMMUNITY)
Admission: RE | Admit: 2018-11-28 | Discharge: 2018-11-28 | Disposition: A | Discharge status: Home / Self Care | Payer: Medicare Other | Source: Ambulatory Visit | Attending: Ophthalmology | Admitting: Ophthalmology

## 2018-11-29 ENCOUNTER — Other Ambulatory Visit: Payer: Self-pay

## 2018-11-29 ENCOUNTER — Encounter (HOSPITAL_COMMUNITY): Payer: Self-pay

## 2018-11-29 MED ORDER — MIDAZOLAM HCL 2 MG/2ML IJ SOLN: 0.5000 mg | Freq: Once | INTRAMUSCULAR | Status: DC | PRN

## 2018-11-29 MED ORDER — LACTATED RINGERS IV SOLN: INTRAVENOUS | Status: DC

## 2018-11-29 MED ORDER — PROMETHAZINE HCL 25 MG/ML IJ SOLN: 6.2500 mg | INTRAMUSCULAR | Status: DC | PRN

## 2018-11-30 ENCOUNTER — Other Ambulatory Visit (HOSPITAL_COMMUNITY)
Admission: RE | Admit: 2018-11-30 | Discharge: 2018-11-30 | Disposition: A | Discharge status: Home / Self Care | Payer: Medicare Other | Source: Ambulatory Visit | Attending: Ophthalmology | Admitting: Ophthalmology

## 2018-11-30 DIAGNOSIS — Z01812 Encounter for preprocedural laboratory examination: Secondary | ICD-10-CM | POA: Diagnosis present

## 2018-11-30 DIAGNOSIS — Z1159 Encounter for screening for other viral diseases: Secondary | ICD-10-CM | POA: Insufficient documentation

## 2018-12-01 LAB — SARS CORONAVIRUS 2 (TAT 6-24 HRS): SARS Coronavirus 2: NEGATIVE

## 2018-12-04 ENCOUNTER — Other Ambulatory Visit: Payer: Self-pay

## 2018-12-04 ENCOUNTER — Encounter (HOSPITAL_COMMUNITY)
Admission: RE | Disposition: A | Discharge status: Home / Self Care | Payer: Self-pay | Source: Home / Self Care | Attending: Ophthalmology

## 2018-12-04 ENCOUNTER — Ambulatory Visit (HOSPITAL_COMMUNITY): Payer: Medicare Other | Admitting: Anesthesiology

## 2018-12-04 ENCOUNTER — Ambulatory Visit (HOSPITAL_COMMUNITY)
Admission: RE | Admit: 2018-12-04 | Discharge: 2018-12-04 | Disposition: A | Discharge status: Home / Self Care | Payer: Medicare Other | Attending: Ophthalmology | Admitting: Ophthalmology

## 2018-12-04 ENCOUNTER — Encounter (HOSPITAL_COMMUNITY): Payer: Self-pay | Admitting: Anesthesiology

## 2018-12-04 DIAGNOSIS — H259 Unspecified age-related cataract: Secondary | ICD-10-CM | POA: Diagnosis present

## 2018-12-04 DIAGNOSIS — E7800 Pure hypercholesterolemia, unspecified: Secondary | ICD-10-CM | POA: Insufficient documentation

## 2018-12-04 DIAGNOSIS — I10 Essential (primary) hypertension: Secondary | ICD-10-CM | POA: Diagnosis not present

## 2018-12-04 DIAGNOSIS — Z79899 Other long term (current) drug therapy: Secondary | ICD-10-CM | POA: Insufficient documentation

## 2018-12-04 HISTORY — PX: CATARACT EXTRACTION W/PHACO: SHX586

## 2018-12-04 SURGERY — PHACOEMULSIFICATION, CATARACT, WITH IOL INSERTION
Anesthesia: Monitor Anesthesia Care | Laterality: Left

## 2018-12-04 MED ORDER — POVIDONE-IODINE 5 % OP SOLN
OPHTHALMIC | Status: DC | PRN
  Administered 2018-12-04: 1 via OPHTHALMIC

## 2018-12-04 MED ORDER — PROVISC 10 MG/ML IO SOLN
INTRAOCULAR | Status: DC | PRN
  Administered 2018-12-04: 0.85 mL via INTRAOCULAR

## 2018-12-04 MED ORDER — BSS IO SOLN
INTRAOCULAR | Status: DC | PRN
  Administered 2018-12-04: 15 mL

## 2018-12-04 MED ORDER — CYCLOPENTOLATE-PHENYLEPHRINE 0.2-1 % OP SOLN
1.0000 [drp] | OPHTHALMIC | Status: AC
  Administered 2018-12-04 (×3): 1 [drp] via OPHTHALMIC

## 2018-12-04 MED ORDER — SODIUM HYALURONATE 23 MG/ML IO SOLN
INTRAOCULAR | Status: DC | PRN
  Administered 2018-12-04: 0.6 mL via INTRAOCULAR

## 2018-12-04 MED ORDER — TETRACAINE HCL 0.5 % OP SOLN
1.0000 [drp] | OPHTHALMIC | Status: AC
  Administered 2018-12-04 (×3): 1 [drp] via OPHTHALMIC

## 2018-12-04 MED ORDER — PHENYLEPHRINE HCL 2.5 % OP SOLN
1.0000 [drp] | OPHTHALMIC | Status: AC
  Administered 2018-12-04 (×3): 1 [drp] via OPHTHALMIC

## 2018-12-04 MED ORDER — LIDOCAINE HCL 3.5 % OP GEL
1.0000 "application " | Freq: Once | OPHTHALMIC | Status: AC
  Administered 2018-12-04: 1 via OPHTHALMIC

## 2018-12-04 MED ORDER — LIDOCAINE HCL (PF) 1 % IJ SOLN
INTRAOCULAR | Status: DC | PRN
  Administered 2018-12-04: 1 mL via OPHTHALMIC

## 2018-12-04 MED ORDER — NEOMYCIN-POLYMYXIN-DEXAMETH 3.5-10000-0.1 OP SUSP
OPHTHALMIC | Status: DC | PRN
  Administered 2018-12-04: 2 [drp] via OPHTHALMIC

## 2018-12-04 MED ORDER — EPINEPHRINE PF 1 MG/ML IJ SOLN
INTRAOCULAR | Status: DC | PRN
  Administered 2018-12-04: 500 mL

## 2018-12-04 SURGICAL SUPPLY — 15 items

## 2018-12-04 NOTE — Anesthesia Postprocedure Evaluation (Signed)
Anesthesia Post Note  Patient: Kathleen Nichols  Procedure(s) Performed: CATARACT EXTRACTION PHACO AND INTRAOCULAR LENS PLACEMENT (IOC) (Left )  Patient location during evaluation: PACU Anesthesia Type: MAC Level of consciousness: awake Pain management: pain level controlled Vital Signs Assessment: post-procedure vital signs reviewed and stable Respiratory status: spontaneous breathing Cardiovascular status: stable Postop Assessment: no apparent nausea or vomiting Anesthetic complications: no     Last Vitals:  Vitals:   12/04/18 0830 12/04/18 0836  BP: 138/64   Resp: 16   Temp: 36.9 C   SpO2: 97% 100%    Last Pain:  Vitals:   12/04/18 0830  PainSc: 0-No pain                 Everette Rank

## 2018-12-04 NOTE — Discharge Instructions (Signed)
Please discharge patient when stable, will follow up today with Dr. Tysheka Fanguy at the [facility] office immediately following discharge.  Leave shield in place until visit.  All paperwork with discharge instructions will be given at the office. ° °

## 2018-12-04 NOTE — Anesthesia Preprocedure Evaluation (Signed)
Anesthesia Evaluation  Patient identified by MRN, date of birth, ID band Patient awake    Reviewed: Allergy & Precautions, H&P , NPO status , Patient's Chart, lab work & pertinent test results, reviewed documented beta blocker date and time   Airway Mallampati: III  TM Distance: >3 FB Neck ROM: full    Dental  (+) Teeth Intact   Pulmonary    Pulmonary exam normal        Cardiovascular hypertension, Normal cardiovascular exam     Neuro/Psych    GI/Hepatic   Endo/Other  Hypothyroidism   Renal/GU CRFRenal disease     Musculoskeletal   Abdominal   Peds  Hematology   Anesthesia Other Findings   Reproductive/Obstetrics                             Anesthesia Physical Anesthesia Plan  ASA: III  Anesthesia Plan: MAC   Post-op Pain Management:    Induction:   PONV Risk Score and Plan: 1 and TIVA  Airway Management Planned:   Additional Equipment:   Intra-op Plan:   Post-operative Plan:   Informed Consent: I have reviewed the patients History and Physical, chart, labs and discussed the procedure including the risks, benefits and alternatives for the proposed anesthesia with the patient or authorized representative who has indicated his/her understanding and acceptance.       Plan Discussed with: CRNA  Anesthesia Plan Comments:         Anesthesia Quick Evaluation

## 2018-12-04 NOTE — Op Note (Signed)
Date of procedure: 12/04/18  Pre-operative diagnosis: Visually significant age-related cataract, Left Eye (H25.12)  Post-operative diagnosis: Visually significant age-related cataract, Left Eye  Procedure: Removal of cataract via phacoemulsification and insertion of intra-ocular lens [employer] PCB00  +25.0D into the capsular bag of the Left Eye  Attending surgeon: Gerda Diss. Naila Elizondo, MD, MA  Anesthesia: MAC, Topical Akten  Complications: None  Estimated Blood Loss: <16m (minimal)  Specimens: None  Implants: As above  Indications:  Visually significant age-related cataract, Left Eye  Procedure:  The patient was seen and identified in the pre-operative area. The operative eye was identified and dilated.  The operative eye was marked.  Topical anesthesia was administered to the operative eye.     The patient was then to the operative suite and placed in the supine position.  A timeout was performed confirming the patient, procedure to be performed, and all other relevant information.   The patient's face was prepped and draped in the usual fashion for intra-ocular surgery.  A lid speculum was placed into the operative eye and the surgical microscope moved into place and focused.  An inferotemporal paracentesis was created using a 20 gauge paracentesis blade.  Shugarcaine was injected into the anterior chamber.  Viscoelastic was injected into the anterior chamber.  A temporal clear-corneal main wound incision was created using a 2.499mmicrokeratome.  A continuous curvilinear capsulorrhexis was initiated using an irrigating cystitome and completed using capsulorrhexis forceps.  Hydrodissection and hydrodeliniation were performed.  Viscoelastic was injected into the anterior chamber.  A phacoemulsification handpiece and a chopper as a second instrument were used to remove the nucleus and epinucleus. The irrigation/aspiration handpiece was used to remove any remaining cortical  material.   The capsular bag was reinflated with viscoelastic, checked, and found to be intact.  The intraocular lens was inserted into the capsular bag and dialed into place using a Kuglen hook.  The irrigation/aspiration handpiece was used to remove any remaining viscoelastic.  The clear corneal wound and paracentesis wounds were then hydrated and checked with Weck-Cels to be watertight.  The lid-speculum and drape was removed, and the patient's face was cleaned with a wet and dry 4x4.  Maxitrol was instilled in the eye before a clear shield was taped over the eye. The patient was taken to the post-operative care unit in good condition, having tolerated the procedure well.  Post-Op Instructions: [facility] and will receive all other orders and instructions.

## 2018-12-04 NOTE — Transfer of Care (Signed)
Immediate Anesthesia Transfer of Care Note  Patient: Kathleen Nichols  Procedure(s) Performed: CATARACT EXTRACTION PHACO AND INTRAOCULAR LENS PLACEMENT (IOC) (Left )  Patient Location: PACU  Anesthesia Type:MAC  Level of Consciousness: awake and patient cooperative  Airway & Oxygen Therapy: Patient Spontanous Breathing  Post-op Assessment: Report given to RN and Post -op Vital signs reviewed and stable  Post vital signs: Reviewed and stable  Last Vitals:  Vitals Value Taken Time  BP    Temp    Pulse    Resp    SpO2      Last Pain:  Vitals:   12/04/18 0830  PainSc: 0-No pain         Complications: No apparent anesthesia complications

## 2018-12-04 NOTE — H&P (Signed)
The H and P was reviewed and updated. The patient was examined.  No changes were found after exam.  The surgical eye was marked.  

## 2018-12-05 ENCOUNTER — Encounter (HOSPITAL_COMMUNITY): Payer: Self-pay | Admitting: Ophthalmology

## 2018-12-06 NOTE — Addendum Note (Signed)
Addendum  created 12/06/18 0743 by Ollen Bowl, CRNA   Charge Capture section accepted

## 2019-05-31 DIAGNOSIS — E211 Secondary hyperparathyroidism, not elsewhere classified: Secondary | ICD-10-CM | POA: Diagnosis not present

## 2019-05-31 DIAGNOSIS — E559 Vitamin D deficiency, unspecified: Secondary | ICD-10-CM | POA: Diagnosis not present

## 2019-05-31 DIAGNOSIS — R809 Proteinuria, unspecified: Secondary | ICD-10-CM | POA: Diagnosis not present

## 2019-05-31 DIAGNOSIS — N189 Chronic kidney disease, unspecified: Secondary | ICD-10-CM | POA: Diagnosis not present

## 2019-05-31 DIAGNOSIS — I129 Hypertensive chronic kidney disease with stage 1 through stage 4 chronic kidney disease, or unspecified chronic kidney disease: Secondary | ICD-10-CM | POA: Diagnosis not present

## 2019-06-06 DIAGNOSIS — R809 Proteinuria, unspecified: Secondary | ICD-10-CM | POA: Diagnosis not present

## 2019-06-06 DIAGNOSIS — E559 Vitamin D deficiency, unspecified: Secondary | ICD-10-CM | POA: Diagnosis not present

## 2019-06-06 DIAGNOSIS — I129 Hypertensive chronic kidney disease with stage 1 through stage 4 chronic kidney disease, or unspecified chronic kidney disease: Secondary | ICD-10-CM | POA: Diagnosis not present

## 2019-06-06 DIAGNOSIS — E211 Secondary hyperparathyroidism, not elsewhere classified: Secondary | ICD-10-CM | POA: Diagnosis not present

## 2019-06-22 DIAGNOSIS — I10 Essential (primary) hypertension: Secondary | ICD-10-CM | POA: Diagnosis not present

## 2019-07-12 IMAGING — US US EXTREM LOW VENOUS*R*
1 series · 13 of 24 positions shown · non-contrast
Comparison: None.

CLINICAL DATA: Right lower extremity pain and edema for the past 2
weeks. Evaluate for DVT.



[Series 1: us extrem low venous*right* · 0.08mm/px · 13 of 35 slices shown]
[im 1/35]
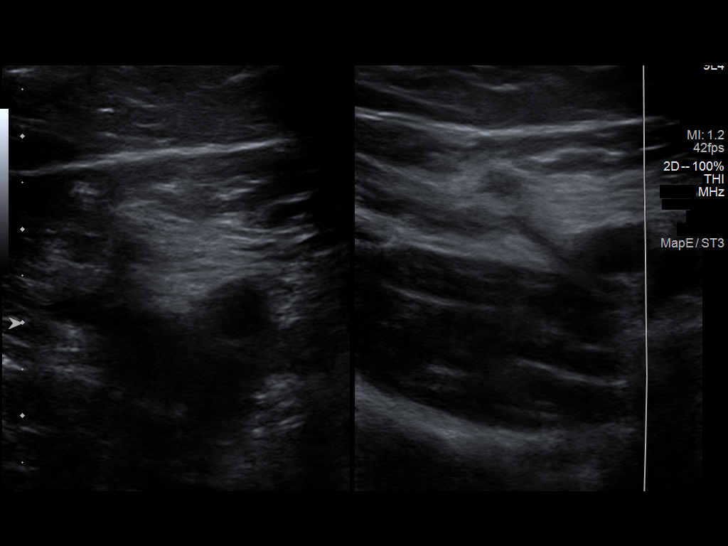
[im 3/35]
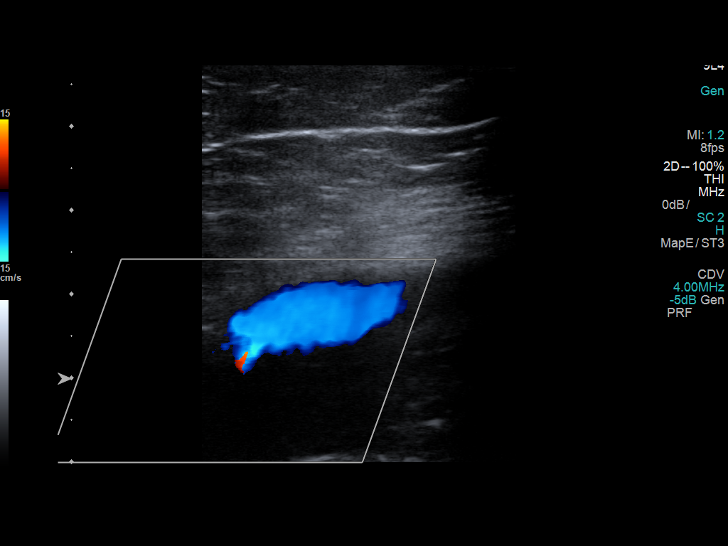
[im 6/35]
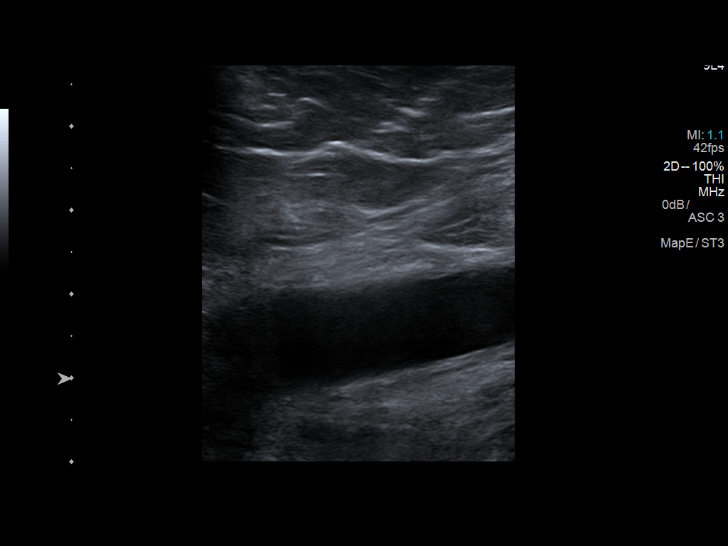
[im 9/35]
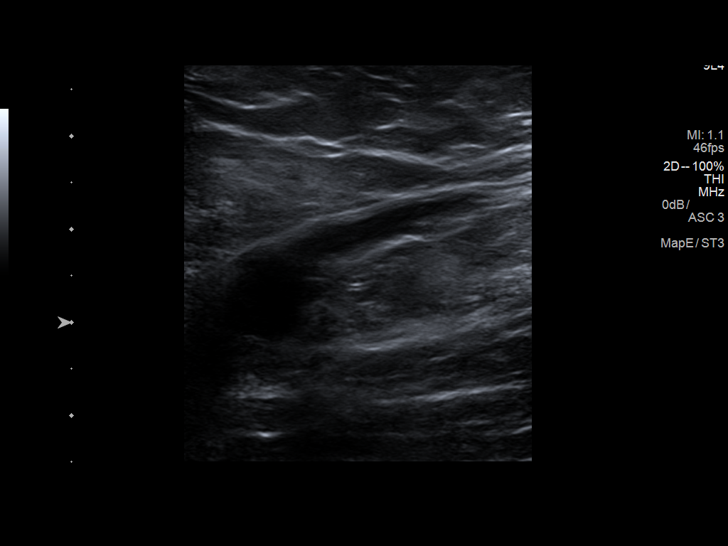
[im 12/35]
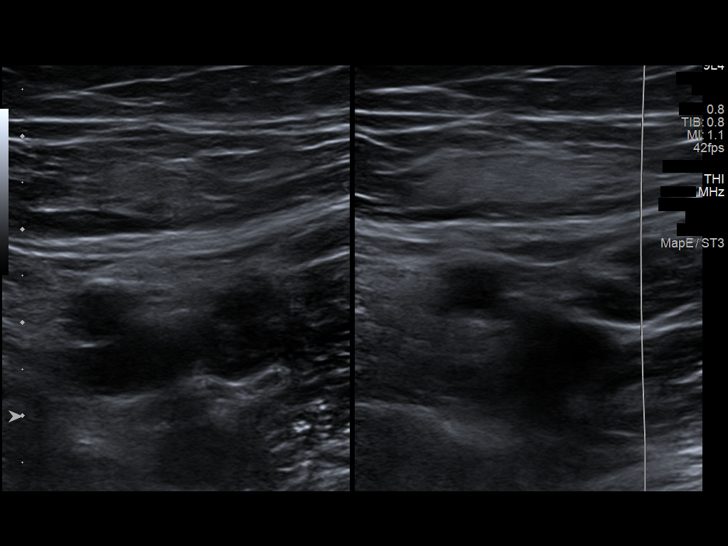
[im 15/35]
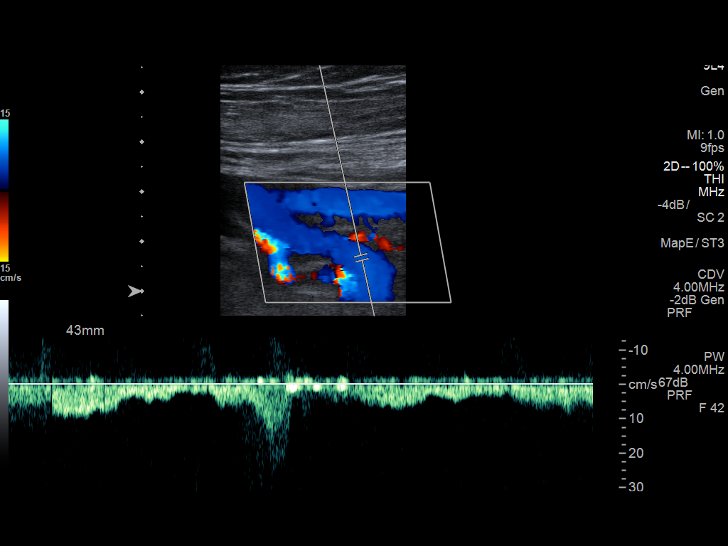
[im 18/35]
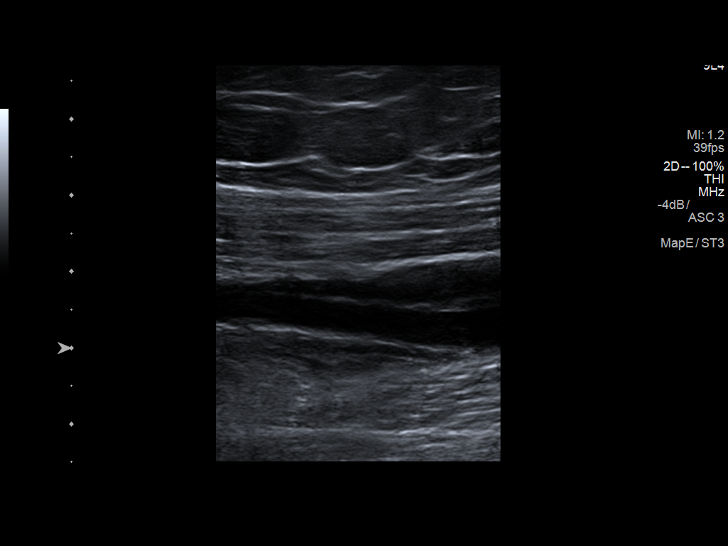
[im 20/35]
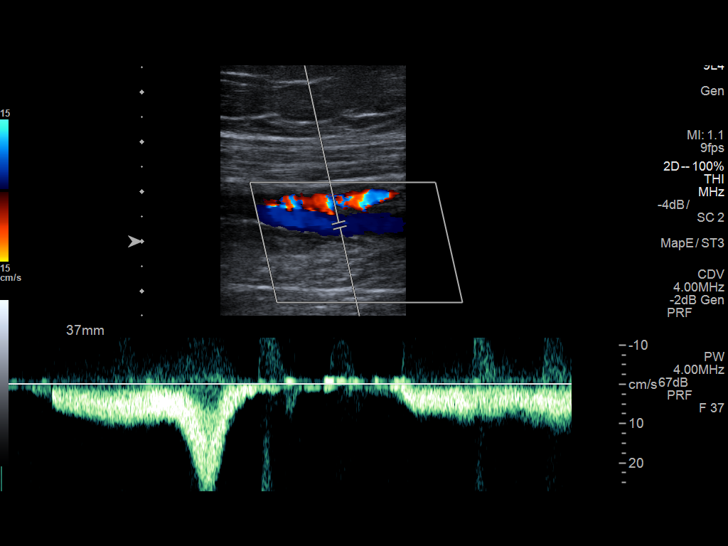
[im 23/35]
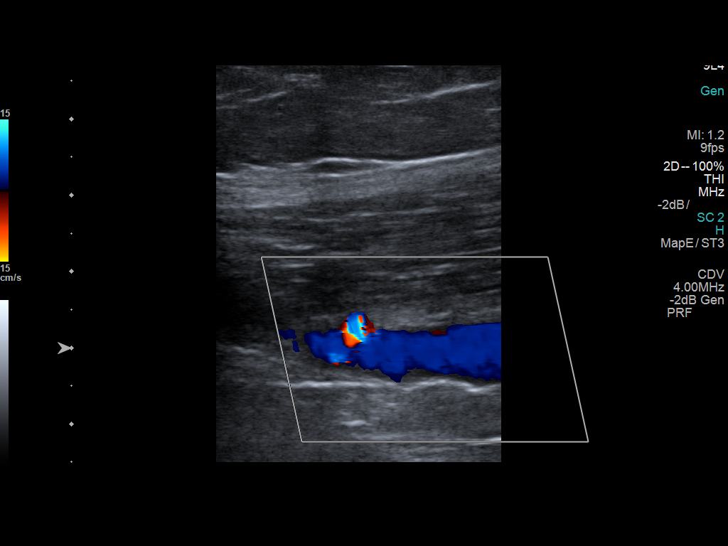
[im 26/35]
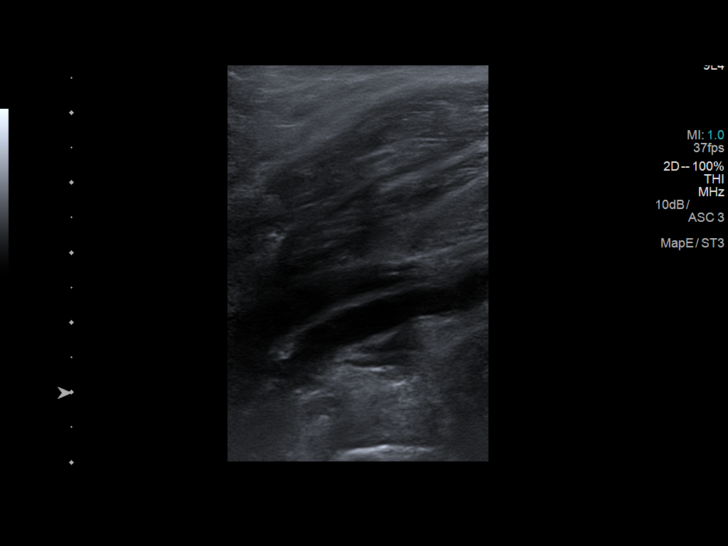
[im 29/35]
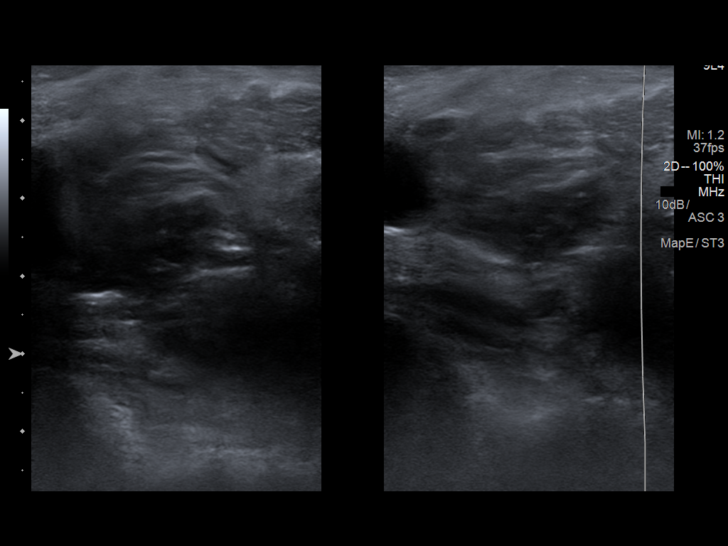
[im 32/35]
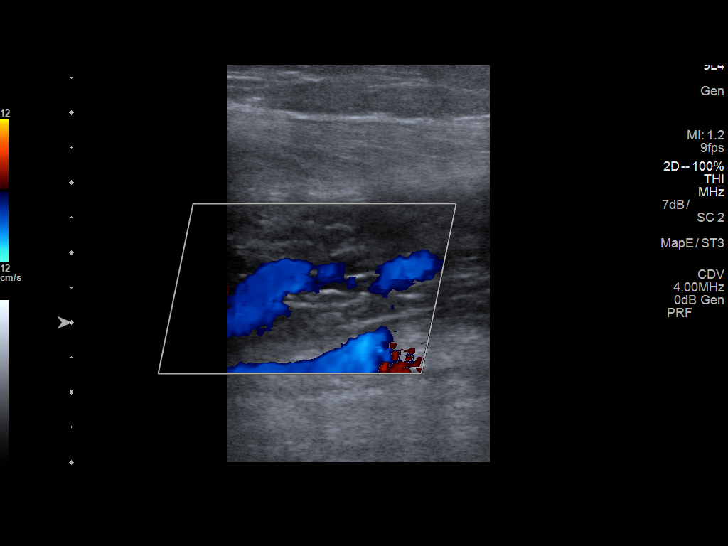
[im 35/35]
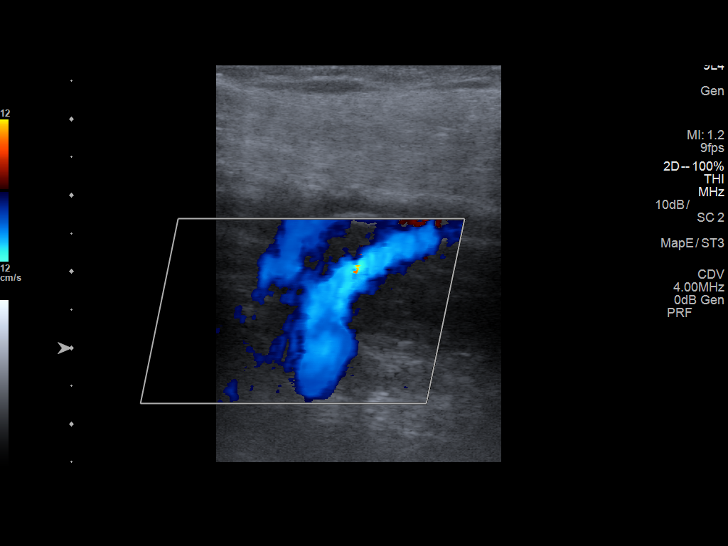

[13 of 24 positions shown; findings below may reference images not displayed]

FINDINGS: Contralateral Common Femoral Vein: Respiratory phasicity is normal
and symmetric with the symptomatic side. No evidence of thrombus.
Normal compressibility.

Common Femoral Vein: No evidence of thrombus. Normal
compressibility, respiratory phasicity and response to augmentation.

Saphenofemoral Junction: No evidence of thrombus. Normal
compressibility and flow on color Doppler imaging.

Profunda Femoral Vein: No evidence of thrombus. Normal
compressibility and flow on color Doppler imaging.

Femoral Vein: No evidence of thrombus. Normal compressibility,
respiratory phasicity and response to augmentation.

Popliteal Vein: No evidence of thrombus. Normal compressibility,
respiratory phasicity and response to augmentation.

Calf Veins: No evidence of thrombus. Normal compressibility and flow
on color Doppler imaging.

Superficial Great Saphenous Vein: No evidence of thrombus. Normal
compressibility.

Venous Reflux:  None.

Other Findings:  None.
IMPRESSION: No evidence of DVT within the right lower extremity.

## 2019-07-22 DIAGNOSIS — I10 Essential (primary) hypertension: Secondary | ICD-10-CM | POA: Diagnosis not present

## 2019-08-08 DIAGNOSIS — I10 Essential (primary) hypertension: Secondary | ICD-10-CM | POA: Diagnosis not present

## 2019-08-08 DIAGNOSIS — E7800 Pure hypercholesterolemia, unspecified: Secondary | ICD-10-CM | POA: Diagnosis not present

## 2019-08-27 DIAGNOSIS — E211 Secondary hyperparathyroidism, not elsewhere classified: Secondary | ICD-10-CM | POA: Diagnosis not present

## 2019-08-27 DIAGNOSIS — Z79899 Other long term (current) drug therapy: Secondary | ICD-10-CM | POA: Diagnosis not present

## 2019-08-27 DIAGNOSIS — N189 Chronic kidney disease, unspecified: Secondary | ICD-10-CM | POA: Diagnosis not present

## 2019-08-27 DIAGNOSIS — R809 Proteinuria, unspecified: Secondary | ICD-10-CM | POA: Diagnosis not present

## 2019-08-27 DIAGNOSIS — D631 Anemia in chronic kidney disease: Secondary | ICD-10-CM | POA: Diagnosis not present

## 2019-08-27 DIAGNOSIS — I129 Hypertensive chronic kidney disease with stage 1 through stage 4 chronic kidney disease, or unspecified chronic kidney disease: Secondary | ICD-10-CM | POA: Diagnosis not present

## 2019-08-27 DIAGNOSIS — E559 Vitamin D deficiency, unspecified: Secondary | ICD-10-CM | POA: Diagnosis not present

## 2019-09-05 DIAGNOSIS — Z79899 Other long term (current) drug therapy: Secondary | ICD-10-CM | POA: Diagnosis not present

## 2019-09-05 DIAGNOSIS — I129 Hypertensive chronic kidney disease with stage 1 through stage 4 chronic kidney disease, or unspecified chronic kidney disease: Secondary | ICD-10-CM | POA: Diagnosis not present

## 2019-09-05 DIAGNOSIS — E559 Vitamin D deficiency, unspecified: Secondary | ICD-10-CM | POA: Diagnosis not present

## 2019-09-05 DIAGNOSIS — E211 Secondary hyperparathyroidism, not elsewhere classified: Secondary | ICD-10-CM | POA: Diagnosis not present

## 2019-09-05 DIAGNOSIS — R809 Proteinuria, unspecified: Secondary | ICD-10-CM | POA: Diagnosis not present

## 2019-09-21 DIAGNOSIS — Z79899 Other long term (current) drug therapy: Secondary | ICD-10-CM | POA: Diagnosis not present

## 2019-09-21 DIAGNOSIS — N184 Chronic kidney disease, stage 4 (severe): Secondary | ICD-10-CM | POA: Diagnosis not present

## 2019-09-21 DIAGNOSIS — I10 Essential (primary) hypertension: Secondary | ICD-10-CM | POA: Diagnosis not present

## 2019-09-21 DIAGNOSIS — Z299 Encounter for prophylactic measures, unspecified: Secondary | ICD-10-CM | POA: Diagnosis not present

## 2019-09-21 DIAGNOSIS — Z0000 Encounter for general adult medical examination without abnormal findings: Secondary | ICD-10-CM | POA: Diagnosis not present

## 2019-09-21 DIAGNOSIS — R5383 Other fatigue: Secondary | ICD-10-CM | POA: Diagnosis not present

## 2019-09-21 DIAGNOSIS — Z1211 Encounter for screening for malignant neoplasm of colon: Secondary | ICD-10-CM | POA: Diagnosis not present

## 2019-09-21 DIAGNOSIS — E7800 Pure hypercholesterolemia, unspecified: Secondary | ICD-10-CM | POA: Diagnosis not present

## 2019-09-21 DIAGNOSIS — Z7189 Other specified counseling: Secondary | ICD-10-CM | POA: Diagnosis not present

## 2019-09-21 DIAGNOSIS — E039 Hypothyroidism, unspecified: Secondary | ICD-10-CM | POA: Diagnosis not present

## 2019-10-26 DIAGNOSIS — M1990 Unspecified osteoarthritis, unspecified site: Secondary | ICD-10-CM | POA: Diagnosis not present

## 2019-10-26 DIAGNOSIS — M79604 Pain in right leg: Secondary | ICD-10-CM | POA: Diagnosis not present

## 2019-10-26 DIAGNOSIS — I10 Essential (primary) hypertension: Secondary | ICD-10-CM | POA: Diagnosis not present

## 2019-10-26 DIAGNOSIS — N184 Chronic kidney disease, stage 4 (severe): Secondary | ICD-10-CM | POA: Diagnosis not present

## 2019-10-26 DIAGNOSIS — Z299 Encounter for prophylactic measures, unspecified: Secondary | ICD-10-CM | POA: Diagnosis not present

## 2019-12-30 DIAGNOSIS — K0889 Other specified disorders of teeth and supporting structures: Secondary | ICD-10-CM | POA: Diagnosis not present

## 2019-12-30 DIAGNOSIS — G44209 Tension-type headache, unspecified, not intractable: Secondary | ICD-10-CM | POA: Diagnosis not present

## 2019-12-30 DIAGNOSIS — R6884 Jaw pain: Secondary | ICD-10-CM | POA: Diagnosis not present

## 2020-01-02 DIAGNOSIS — E211 Secondary hyperparathyroidism, not elsewhere classified: Secondary | ICD-10-CM | POA: Diagnosis not present

## 2020-01-02 DIAGNOSIS — R809 Proteinuria, unspecified: Secondary | ICD-10-CM | POA: Diagnosis not present

## 2020-01-09 DIAGNOSIS — R809 Proteinuria, unspecified: Secondary | ICD-10-CM | POA: Diagnosis not present

## 2020-01-09 DIAGNOSIS — I129 Hypertensive chronic kidney disease with stage 1 through stage 4 chronic kidney disease, or unspecified chronic kidney disease: Secondary | ICD-10-CM | POA: Diagnosis not present

## 2020-01-09 DIAGNOSIS — E559 Vitamin D deficiency, unspecified: Secondary | ICD-10-CM | POA: Diagnosis not present

## 2020-01-09 DIAGNOSIS — E211 Secondary hyperparathyroidism, not elsewhere classified: Secondary | ICD-10-CM | POA: Diagnosis not present

## 2020-03-28 DIAGNOSIS — I129 Hypertensive chronic kidney disease with stage 1 through stage 4 chronic kidney disease, or unspecified chronic kidney disease: Secondary | ICD-10-CM | POA: Diagnosis not present

## 2020-03-28 DIAGNOSIS — R809 Proteinuria, unspecified: Secondary | ICD-10-CM | POA: Diagnosis not present

## 2020-04-10 DIAGNOSIS — R809 Proteinuria, unspecified: Secondary | ICD-10-CM | POA: Diagnosis not present

## 2020-04-10 DIAGNOSIS — I129 Hypertensive chronic kidney disease with stage 1 through stage 4 chronic kidney disease, or unspecified chronic kidney disease: Secondary | ICD-10-CM | POA: Diagnosis not present

## 2020-04-10 DIAGNOSIS — N1832 Chronic kidney disease, stage 3b: Secondary | ICD-10-CM | POA: Diagnosis not present

## 2020-04-10 DIAGNOSIS — E876 Hypokalemia: Secondary | ICD-10-CM | POA: Diagnosis not present

## 2020-05-02 DIAGNOSIS — I129 Hypertensive chronic kidney disease with stage 1 through stage 4 chronic kidney disease, or unspecified chronic kidney disease: Secondary | ICD-10-CM | POA: Diagnosis not present

## 2020-06-23 DIAGNOSIS — E7800 Pure hypercholesterolemia, unspecified: Secondary | ICD-10-CM | POA: Diagnosis not present

## 2020-06-23 DIAGNOSIS — M1990 Unspecified osteoarthritis, unspecified site: Secondary | ICD-10-CM | POA: Diagnosis not present

## 2020-06-30 DIAGNOSIS — N1832 Chronic kidney disease, stage 3b: Secondary | ICD-10-CM | POA: Diagnosis not present

## 2020-06-30 DIAGNOSIS — N183 Chronic kidney disease, stage 3 unspecified: Secondary | ICD-10-CM | POA: Diagnosis not present

## 2020-06-30 DIAGNOSIS — E876 Hypokalemia: Secondary | ICD-10-CM | POA: Diagnosis not present

## 2020-06-30 DIAGNOSIS — I129 Hypertensive chronic kidney disease with stage 1 through stage 4 chronic kidney disease, or unspecified chronic kidney disease: Secondary | ICD-10-CM | POA: Diagnosis not present

## 2020-07-11 DIAGNOSIS — N1832 Chronic kidney disease, stage 3b: Secondary | ICD-10-CM | POA: Diagnosis not present

## 2020-07-11 DIAGNOSIS — I129 Hypertensive chronic kidney disease with stage 1 through stage 4 chronic kidney disease, or unspecified chronic kidney disease: Secondary | ICD-10-CM | POA: Diagnosis not present

## 2020-07-11 DIAGNOSIS — R6889 Other general symptoms and signs: Secondary | ICD-10-CM | POA: Diagnosis not present

## 2020-07-11 DIAGNOSIS — R809 Proteinuria, unspecified: Secondary | ICD-10-CM | POA: Diagnosis not present

## 2020-07-11 DIAGNOSIS — E876 Hypokalemia: Secondary | ICD-10-CM | POA: Diagnosis not present

## 2020-07-11 DIAGNOSIS — E211 Secondary hyperparathyroidism, not elsewhere classified: Secondary | ICD-10-CM | POA: Diagnosis not present

## 2020-08-11 DIAGNOSIS — E039 Hypothyroidism, unspecified: Secondary | ICD-10-CM | POA: Diagnosis not present

## 2020-08-11 DIAGNOSIS — N184 Chronic kidney disease, stage 4 (severe): Secondary | ICD-10-CM | POA: Diagnosis not present

## 2020-08-11 DIAGNOSIS — Z299 Encounter for prophylactic measures, unspecified: Secondary | ICD-10-CM | POA: Diagnosis not present

## 2020-08-11 DIAGNOSIS — I10 Essential (primary) hypertension: Secondary | ICD-10-CM | POA: Diagnosis not present

## 2020-08-11 DIAGNOSIS — M1811 Unilateral primary osteoarthritis of first carpometacarpal joint, right hand: Secondary | ICD-10-CM | POA: Diagnosis not present

## 2020-08-20 DIAGNOSIS — E7800 Pure hypercholesterolemia, unspecified: Secondary | ICD-10-CM | POA: Diagnosis not present

## 2020-08-20 DIAGNOSIS — I10 Essential (primary) hypertension: Secondary | ICD-10-CM | POA: Diagnosis not present

## 2020-09-26 DIAGNOSIS — I129 Hypertensive chronic kidney disease with stage 1 through stage 4 chronic kidney disease, or unspecified chronic kidney disease: Secondary | ICD-10-CM | POA: Diagnosis not present

## 2020-09-26 DIAGNOSIS — E876 Hypokalemia: Secondary | ICD-10-CM | POA: Diagnosis not present

## 2020-09-26 DIAGNOSIS — E7800 Pure hypercholesterolemia, unspecified: Secondary | ICD-10-CM | POA: Diagnosis not present

## 2020-09-26 DIAGNOSIS — Z0000 Encounter for general adult medical examination without abnormal findings: Secondary | ICD-10-CM | POA: Diagnosis not present

## 2020-09-26 DIAGNOSIS — N184 Chronic kidney disease, stage 4 (severe): Secondary | ICD-10-CM | POA: Diagnosis not present

## 2020-09-26 DIAGNOSIS — R5383 Other fatigue: Secondary | ICD-10-CM | POA: Diagnosis not present

## 2020-09-26 DIAGNOSIS — Z7189 Other specified counseling: Secondary | ICD-10-CM | POA: Diagnosis not present

## 2020-09-26 DIAGNOSIS — I10 Essential (primary) hypertension: Secondary | ICD-10-CM | POA: Diagnosis not present

## 2020-09-26 DIAGNOSIS — N1832 Chronic kidney disease, stage 3b: Secondary | ICD-10-CM | POA: Diagnosis not present

## 2020-09-26 DIAGNOSIS — E039 Hypothyroidism, unspecified: Secondary | ICD-10-CM | POA: Diagnosis not present

## 2020-09-26 DIAGNOSIS — Z299 Encounter for prophylactic measures, unspecified: Secondary | ICD-10-CM | POA: Diagnosis not present

## 2020-09-26 DIAGNOSIS — Z79899 Other long term (current) drug therapy: Secondary | ICD-10-CM | POA: Diagnosis not present

## 2020-10-09 DIAGNOSIS — R809 Proteinuria, unspecified: Secondary | ICD-10-CM | POA: Diagnosis not present

## 2020-10-09 DIAGNOSIS — R6889 Other general symptoms and signs: Secondary | ICD-10-CM | POA: Diagnosis not present

## 2020-10-09 DIAGNOSIS — I129 Hypertensive chronic kidney disease with stage 1 through stage 4 chronic kidney disease, or unspecified chronic kidney disease: Secondary | ICD-10-CM | POA: Diagnosis not present

## 2020-10-09 DIAGNOSIS — E211 Secondary hyperparathyroidism, not elsewhere classified: Secondary | ICD-10-CM | POA: Diagnosis not present

## 2020-10-09 DIAGNOSIS — N1832 Chronic kidney disease, stage 3b: Secondary | ICD-10-CM | POA: Diagnosis not present

## 2020-10-20 DIAGNOSIS — E7800 Pure hypercholesterolemia, unspecified: Secondary | ICD-10-CM | POA: Diagnosis not present

## 2020-10-20 DIAGNOSIS — M1990 Unspecified osteoarthritis, unspecified site: Secondary | ICD-10-CM | POA: Diagnosis not present

## 2020-11-20 DIAGNOSIS — M1990 Unspecified osteoarthritis, unspecified site: Secondary | ICD-10-CM | POA: Diagnosis not present

## 2020-11-20 DIAGNOSIS — E7800 Pure hypercholesterolemia, unspecified: Secondary | ICD-10-CM | POA: Diagnosis not present

## 2020-12-21 DIAGNOSIS — E7800 Pure hypercholesterolemia, unspecified: Secondary | ICD-10-CM | POA: Diagnosis not present

## 2020-12-21 DIAGNOSIS — M1990 Unspecified osteoarthritis, unspecified site: Secondary | ICD-10-CM | POA: Diagnosis not present

## 2020-12-29 DIAGNOSIS — Z299 Encounter for prophylactic measures, unspecified: Secondary | ICD-10-CM | POA: Diagnosis not present

## 2020-12-29 DIAGNOSIS — E211 Secondary hyperparathyroidism, not elsewhere classified: Secondary | ICD-10-CM | POA: Diagnosis not present

## 2020-12-29 DIAGNOSIS — E7800 Pure hypercholesterolemia, unspecified: Secondary | ICD-10-CM | POA: Diagnosis not present

## 2020-12-29 DIAGNOSIS — I10 Essential (primary) hypertension: Secondary | ICD-10-CM | POA: Diagnosis not present

## 2021-01-14 DIAGNOSIS — N1832 Chronic kidney disease, stage 3b: Secondary | ICD-10-CM | POA: Diagnosis not present

## 2021-01-14 DIAGNOSIS — D696 Thrombocytopenia, unspecified: Secondary | ICD-10-CM | POA: Diagnosis not present

## 2021-01-14 DIAGNOSIS — I129 Hypertensive chronic kidney disease with stage 1 through stage 4 chronic kidney disease, or unspecified chronic kidney disease: Secondary | ICD-10-CM | POA: Diagnosis not present

## 2021-01-14 DIAGNOSIS — R809 Proteinuria, unspecified: Secondary | ICD-10-CM | POA: Diagnosis not present

## 2021-01-21 DIAGNOSIS — I10 Essential (primary) hypertension: Secondary | ICD-10-CM | POA: Diagnosis not present

## 2021-02-20 DIAGNOSIS — I10 Essential (primary) hypertension: Secondary | ICD-10-CM | POA: Diagnosis not present

## 2021-03-23 DIAGNOSIS — I10 Essential (primary) hypertension: Secondary | ICD-10-CM | POA: Diagnosis not present

## 2021-04-10 DIAGNOSIS — N1832 Chronic kidney disease, stage 3b: Secondary | ICD-10-CM | POA: Diagnosis not present

## 2021-04-10 DIAGNOSIS — Z20822 Contact with and (suspected) exposure to covid-19: Secondary | ICD-10-CM | POA: Diagnosis not present

## 2021-04-10 DIAGNOSIS — D696 Thrombocytopenia, unspecified: Secondary | ICD-10-CM | POA: Diagnosis not present

## 2021-04-10 DIAGNOSIS — R059 Cough, unspecified: Secondary | ICD-10-CM | POA: Diagnosis not present

## 2021-04-22 DIAGNOSIS — E7800 Pure hypercholesterolemia, unspecified: Secondary | ICD-10-CM | POA: Diagnosis not present

## 2021-04-22 DIAGNOSIS — I10 Essential (primary) hypertension: Secondary | ICD-10-CM | POA: Diagnosis not present

## 2021-04-22 DIAGNOSIS — M1990 Unspecified osteoarthritis, unspecified site: Secondary | ICD-10-CM | POA: Diagnosis not present

## 2021-04-22 DIAGNOSIS — M545 Low back pain, unspecified: Secondary | ICD-10-CM | POA: Diagnosis not present

## 2021-04-23 DIAGNOSIS — N184 Chronic kidney disease, stage 4 (severe): Secondary | ICD-10-CM | POA: Diagnosis not present

## 2021-04-23 DIAGNOSIS — I5032 Chronic diastolic (congestive) heart failure: Secondary | ICD-10-CM | POA: Diagnosis not present

## 2021-04-23 DIAGNOSIS — E876 Hypokalemia: Secondary | ICD-10-CM | POA: Diagnosis not present

## 2021-04-23 DIAGNOSIS — R809 Proteinuria, unspecified: Secondary | ICD-10-CM | POA: Diagnosis not present

## 2021-04-23 DIAGNOSIS — I129 Hypertensive chronic kidney disease with stage 1 through stage 4 chronic kidney disease, or unspecified chronic kidney disease: Secondary | ICD-10-CM | POA: Diagnosis not present

## 2021-05-22 DIAGNOSIS — I10 Essential (primary) hypertension: Secondary | ICD-10-CM | POA: Diagnosis not present

## 2021-06-21 DIAGNOSIS — I10 Essential (primary) hypertension: Secondary | ICD-10-CM | POA: Diagnosis not present

## 2021-07-21 DIAGNOSIS — E7800 Pure hypercholesterolemia, unspecified: Secondary | ICD-10-CM | POA: Diagnosis not present

## 2021-07-21 DIAGNOSIS — I10 Essential (primary) hypertension: Secondary | ICD-10-CM | POA: Diagnosis not present

## 2021-07-21 DIAGNOSIS — M1990 Unspecified osteoarthritis, unspecified site: Secondary | ICD-10-CM | POA: Diagnosis not present

## 2021-07-21 DIAGNOSIS — M545 Low back pain, unspecified: Secondary | ICD-10-CM | POA: Diagnosis not present

## 2021-07-24 DIAGNOSIS — I129 Hypertensive chronic kidney disease with stage 1 through stage 4 chronic kidney disease, or unspecified chronic kidney disease: Secondary | ICD-10-CM | POA: Diagnosis not present

## 2021-07-24 DIAGNOSIS — R809 Proteinuria, unspecified: Secondary | ICD-10-CM | POA: Diagnosis not present

## 2021-07-24 DIAGNOSIS — N184 Chronic kidney disease, stage 4 (severe): Secondary | ICD-10-CM | POA: Diagnosis not present

## 2021-08-05 DIAGNOSIS — R809 Proteinuria, unspecified: Secondary | ICD-10-CM | POA: Diagnosis not present

## 2021-08-05 DIAGNOSIS — N184 Chronic kidney disease, stage 4 (severe): Secondary | ICD-10-CM | POA: Diagnosis not present

## 2021-08-05 DIAGNOSIS — I129 Hypertensive chronic kidney disease with stage 1 through stage 4 chronic kidney disease, or unspecified chronic kidney disease: Secondary | ICD-10-CM | POA: Diagnosis not present

## 2021-08-05 DIAGNOSIS — E876 Hypokalemia: Secondary | ICD-10-CM | POA: Diagnosis not present

## 2021-08-05 DIAGNOSIS — I5032 Chronic diastolic (congestive) heart failure: Secondary | ICD-10-CM | POA: Diagnosis not present

## 2021-08-20 DIAGNOSIS — M1990 Unspecified osteoarthritis, unspecified site: Secondary | ICD-10-CM | POA: Diagnosis not present

## 2021-08-20 DIAGNOSIS — E7800 Pure hypercholesterolemia, unspecified: Secondary | ICD-10-CM | POA: Diagnosis not present

## 2021-08-20 DIAGNOSIS — M545 Low back pain, unspecified: Secondary | ICD-10-CM | POA: Diagnosis not present

## 2021-08-20 DIAGNOSIS — I10 Essential (primary) hypertension: Secondary | ICD-10-CM | POA: Diagnosis not present

## 2021-09-20 DIAGNOSIS — I10 Essential (primary) hypertension: Secondary | ICD-10-CM | POA: Diagnosis not present

## 2021-10-02 DIAGNOSIS — Z79899 Other long term (current) drug therapy: Secondary | ICD-10-CM | POA: Diagnosis not present

## 2021-10-02 DIAGNOSIS — Z299 Encounter for prophylactic measures, unspecified: Secondary | ICD-10-CM | POA: Diagnosis not present

## 2021-10-02 DIAGNOSIS — Z0000 Encounter for general adult medical examination without abnormal findings: Secondary | ICD-10-CM | POA: Diagnosis not present

## 2021-10-02 DIAGNOSIS — N1832 Chronic kidney disease, stage 3b: Secondary | ICD-10-CM | POA: Diagnosis not present

## 2021-10-02 DIAGNOSIS — Z789 Other specified health status: Secondary | ICD-10-CM | POA: Diagnosis not present

## 2021-10-02 DIAGNOSIS — R5383 Other fatigue: Secondary | ICD-10-CM | POA: Diagnosis not present

## 2021-10-02 DIAGNOSIS — I10 Essential (primary) hypertension: Secondary | ICD-10-CM | POA: Diagnosis not present

## 2021-10-02 DIAGNOSIS — E7800 Pure hypercholesterolemia, unspecified: Secondary | ICD-10-CM | POA: Diagnosis not present

## 2021-10-02 DIAGNOSIS — Z7189 Other specified counseling: Secondary | ICD-10-CM | POA: Diagnosis not present

## 2021-10-20 DIAGNOSIS — I10 Essential (primary) hypertension: Secondary | ICD-10-CM | POA: Diagnosis not present

## 2021-10-30 DIAGNOSIS — I129 Hypertensive chronic kidney disease with stage 1 through stage 4 chronic kidney disease, or unspecified chronic kidney disease: Secondary | ICD-10-CM | POA: Diagnosis not present

## 2021-10-30 DIAGNOSIS — I5032 Chronic diastolic (congestive) heart failure: Secondary | ICD-10-CM | POA: Diagnosis not present

## 2021-10-30 DIAGNOSIS — E876 Hypokalemia: Secondary | ICD-10-CM | POA: Diagnosis not present

## 2021-10-30 DIAGNOSIS — N184 Chronic kidney disease, stage 4 (severe): Secondary | ICD-10-CM | POA: Diagnosis not present

## 2021-10-30 DIAGNOSIS — R809 Proteinuria, unspecified: Secondary | ICD-10-CM | POA: Diagnosis not present

## 2021-11-12 DIAGNOSIS — I129 Hypertensive chronic kidney disease with stage 1 through stage 4 chronic kidney disease, or unspecified chronic kidney disease: Secondary | ICD-10-CM | POA: Diagnosis not present

## 2021-11-12 DIAGNOSIS — D696 Thrombocytopenia, unspecified: Secondary | ICD-10-CM | POA: Diagnosis not present

## 2021-11-12 DIAGNOSIS — I5032 Chronic diastolic (congestive) heart failure: Secondary | ICD-10-CM | POA: Diagnosis not present

## 2021-11-12 DIAGNOSIS — N1832 Chronic kidney disease, stage 3b: Secondary | ICD-10-CM | POA: Diagnosis not present

## 2021-11-12 DIAGNOSIS — E211 Secondary hyperparathyroidism, not elsewhere classified: Secondary | ICD-10-CM | POA: Diagnosis not present

## 2021-11-19 DIAGNOSIS — I10 Essential (primary) hypertension: Secondary | ICD-10-CM | POA: Diagnosis not present

## 2021-11-20 DIAGNOSIS — E7800 Pure hypercholesterolemia, unspecified: Secondary | ICD-10-CM | POA: Diagnosis not present

## 2021-11-20 DIAGNOSIS — M1990 Unspecified osteoarthritis, unspecified site: Secondary | ICD-10-CM | POA: Diagnosis not present

## 2021-12-21 DIAGNOSIS — I10 Essential (primary) hypertension: Secondary | ICD-10-CM | POA: Diagnosis not present

## 2021-12-25 DIAGNOSIS — I10 Essential (primary) hypertension: Secondary | ICD-10-CM | POA: Diagnosis not present

## 2021-12-25 DIAGNOSIS — R5383 Other fatigue: Secondary | ICD-10-CM | POA: Diagnosis not present

## 2021-12-25 DIAGNOSIS — Z299 Encounter for prophylactic measures, unspecified: Secondary | ICD-10-CM | POA: Diagnosis not present

## 2022-01-20 DIAGNOSIS — I10 Essential (primary) hypertension: Secondary | ICD-10-CM | POA: Diagnosis not present

## 2022-02-05 DIAGNOSIS — I129 Hypertensive chronic kidney disease with stage 1 through stage 4 chronic kidney disease, or unspecified chronic kidney disease: Secondary | ICD-10-CM | POA: Diagnosis not present

## 2022-02-05 DIAGNOSIS — N1832 Chronic kidney disease, stage 3b: Secondary | ICD-10-CM | POA: Diagnosis not present

## 2022-02-05 DIAGNOSIS — D696 Thrombocytopenia, unspecified: Secondary | ICD-10-CM | POA: Diagnosis not present

## 2022-02-05 DIAGNOSIS — E211 Secondary hyperparathyroidism, not elsewhere classified: Secondary | ICD-10-CM | POA: Diagnosis not present

## 2022-02-05 DIAGNOSIS — I5032 Chronic diastolic (congestive) heart failure: Secondary | ICD-10-CM | POA: Diagnosis not present

## 2022-02-17 DIAGNOSIS — E211 Secondary hyperparathyroidism, not elsewhere classified: Secondary | ICD-10-CM | POA: Diagnosis not present

## 2022-02-17 DIAGNOSIS — R809 Proteinuria, unspecified: Secondary | ICD-10-CM | POA: Diagnosis not present

## 2022-02-17 DIAGNOSIS — I5032 Chronic diastolic (congestive) heart failure: Secondary | ICD-10-CM | POA: Diagnosis not present

## 2022-02-17 DIAGNOSIS — N1832 Chronic kidney disease, stage 3b: Secondary | ICD-10-CM | POA: Diagnosis not present

## 2022-02-17 DIAGNOSIS — D696 Thrombocytopenia, unspecified: Secondary | ICD-10-CM | POA: Diagnosis not present

## 2022-02-17 DIAGNOSIS — I129 Hypertensive chronic kidney disease with stage 1 through stage 4 chronic kidney disease, or unspecified chronic kidney disease: Secondary | ICD-10-CM | POA: Diagnosis not present

## 2022-02-19 DIAGNOSIS — I10 Essential (primary) hypertension: Secondary | ICD-10-CM | POA: Diagnosis not present

## 2022-03-22 DIAGNOSIS — I10 Essential (primary) hypertension: Secondary | ICD-10-CM | POA: Diagnosis not present

## 2022-04-21 DIAGNOSIS — I10 Essential (primary) hypertension: Secondary | ICD-10-CM | POA: Diagnosis not present

## 2022-04-30 DIAGNOSIS — D699 Hemorrhagic condition, unspecified: Secondary | ICD-10-CM | POA: Diagnosis not present

## 2022-04-30 DIAGNOSIS — E211 Secondary hyperparathyroidism, not elsewhere classified: Secondary | ICD-10-CM | POA: Diagnosis not present

## 2022-04-30 DIAGNOSIS — I5032 Chronic diastolic (congestive) heart failure: Secondary | ICD-10-CM | POA: Diagnosis not present

## 2022-04-30 DIAGNOSIS — I129 Hypertensive chronic kidney disease with stage 1 through stage 4 chronic kidney disease, or unspecified chronic kidney disease: Secondary | ICD-10-CM | POA: Diagnosis not present

## 2022-04-30 DIAGNOSIS — N1832 Chronic kidney disease, stage 3b: Secondary | ICD-10-CM | POA: Diagnosis not present

## 2022-04-30 DIAGNOSIS — R809 Proteinuria, unspecified: Secondary | ICD-10-CM | POA: Diagnosis not present

## 2022-05-14 DIAGNOSIS — R5383 Other fatigue: Secondary | ICD-10-CM | POA: Diagnosis not present

## 2022-05-14 DIAGNOSIS — Z299 Encounter for prophylactic measures, unspecified: Secondary | ICD-10-CM | POA: Diagnosis not present

## 2022-05-14 DIAGNOSIS — I10 Essential (primary) hypertension: Secondary | ICD-10-CM | POA: Diagnosis not present

## 2022-05-14 DIAGNOSIS — E039 Hypothyroidism, unspecified: Secondary | ICD-10-CM | POA: Diagnosis not present

## 2022-05-14 DIAGNOSIS — E7800 Pure hypercholesterolemia, unspecified: Secondary | ICD-10-CM | POA: Diagnosis not present

## 2022-05-14 DIAGNOSIS — Z0000 Encounter for general adult medical examination without abnormal findings: Secondary | ICD-10-CM | POA: Diagnosis not present

## 2022-05-14 DIAGNOSIS — Z789 Other specified health status: Secondary | ICD-10-CM | POA: Diagnosis not present

## 2022-06-28 DIAGNOSIS — E211 Secondary hyperparathyroidism, not elsewhere classified: Secondary | ICD-10-CM | POA: Diagnosis not present

## 2022-06-28 DIAGNOSIS — R809 Proteinuria, unspecified: Secondary | ICD-10-CM | POA: Diagnosis not present

## 2022-06-28 DIAGNOSIS — I129 Hypertensive chronic kidney disease with stage 1 through stage 4 chronic kidney disease, or unspecified chronic kidney disease: Secondary | ICD-10-CM | POA: Diagnosis not present

## 2022-06-28 DIAGNOSIS — I5032 Chronic diastolic (congestive) heart failure: Secondary | ICD-10-CM | POA: Diagnosis not present

## 2022-06-28 DIAGNOSIS — N1832 Chronic kidney disease, stage 3b: Secondary | ICD-10-CM | POA: Diagnosis not present

## 2022-09-03 DIAGNOSIS — N1832 Chronic kidney disease, stage 3b: Secondary | ICD-10-CM | POA: Diagnosis not present

## 2022-09-03 DIAGNOSIS — E211 Secondary hyperparathyroidism, not elsewhere classified: Secondary | ICD-10-CM | POA: Diagnosis not present

## 2022-09-03 DIAGNOSIS — I129 Hypertensive chronic kidney disease with stage 1 through stage 4 chronic kidney disease, or unspecified chronic kidney disease: Secondary | ICD-10-CM | POA: Diagnosis not present

## 2022-09-03 DIAGNOSIS — I5032 Chronic diastolic (congestive) heart failure: Secondary | ICD-10-CM | POA: Diagnosis not present

## 2022-10-01 DIAGNOSIS — Z7189 Other specified counseling: Secondary | ICD-10-CM | POA: Diagnosis not present

## 2022-10-01 DIAGNOSIS — Z0000 Encounter for general adult medical examination without abnormal findings: Secondary | ICD-10-CM | POA: Diagnosis not present

## 2022-10-01 DIAGNOSIS — I10 Essential (primary) hypertension: Secondary | ICD-10-CM | POA: Diagnosis not present

## 2022-10-01 DIAGNOSIS — Z299 Encounter for prophylactic measures, unspecified: Secondary | ICD-10-CM | POA: Diagnosis not present

## 2022-10-08 DIAGNOSIS — I129 Hypertensive chronic kidney disease with stage 1 through stage 4 chronic kidney disease, or unspecified chronic kidney disease: Secondary | ICD-10-CM | POA: Diagnosis not present

## 2022-10-08 DIAGNOSIS — I5032 Chronic diastolic (congestive) heart failure: Secondary | ICD-10-CM | POA: Diagnosis not present

## 2022-10-08 DIAGNOSIS — R809 Proteinuria, unspecified: Secondary | ICD-10-CM | POA: Diagnosis not present

## 2022-10-08 DIAGNOSIS — N2581 Secondary hyperparathyroidism of renal origin: Secondary | ICD-10-CM | POA: Diagnosis not present

## 2022-11-12 DIAGNOSIS — Z79899 Other long term (current) drug therapy: Secondary | ICD-10-CM | POA: Diagnosis not present

## 2022-11-12 DIAGNOSIS — E039 Hypothyroidism, unspecified: Secondary | ICD-10-CM | POA: Diagnosis not present

## 2022-11-12 DIAGNOSIS — E559 Vitamin D deficiency, unspecified: Secondary | ICD-10-CM | POA: Diagnosis not present

## 2022-11-12 DIAGNOSIS — R5383 Other fatigue: Secondary | ICD-10-CM | POA: Diagnosis not present

## 2022-11-12 DIAGNOSIS — M79671 Pain in right foot: Secondary | ICD-10-CM | POA: Diagnosis not present

## 2022-11-12 DIAGNOSIS — Z299 Encounter for prophylactic measures, unspecified: Secondary | ICD-10-CM | POA: Diagnosis not present

## 2022-11-12 DIAGNOSIS — Z0000 Encounter for general adult medical examination without abnormal findings: Secondary | ICD-10-CM | POA: Diagnosis not present

## 2022-11-12 DIAGNOSIS — E7800 Pure hypercholesterolemia, unspecified: Secondary | ICD-10-CM | POA: Diagnosis not present

## 2022-11-12 DIAGNOSIS — I10 Essential (primary) hypertension: Secondary | ICD-10-CM | POA: Diagnosis not present

## 2022-12-24 DIAGNOSIS — Z299 Encounter for prophylactic measures, unspecified: Secondary | ICD-10-CM | POA: Diagnosis not present

## 2022-12-24 DIAGNOSIS — N184 Chronic kidney disease, stage 4 (severe): Secondary | ICD-10-CM | POA: Diagnosis not present

## 2022-12-24 DIAGNOSIS — N1832 Chronic kidney disease, stage 3b: Secondary | ICD-10-CM | POA: Diagnosis not present

## 2022-12-24 DIAGNOSIS — I10 Essential (primary) hypertension: Secondary | ICD-10-CM | POA: Diagnosis not present

## 2022-12-24 DIAGNOSIS — M19079 Primary osteoarthritis, unspecified ankle and foot: Secondary | ICD-10-CM | POA: Diagnosis not present

## 2023-01-10 DIAGNOSIS — I129 Hypertensive chronic kidney disease with stage 1 through stage 4 chronic kidney disease, or unspecified chronic kidney disease: Secondary | ICD-10-CM | POA: Diagnosis not present

## 2023-01-10 DIAGNOSIS — N2581 Secondary hyperparathyroidism of renal origin: Secondary | ICD-10-CM | POA: Diagnosis not present

## 2023-01-10 DIAGNOSIS — R809 Proteinuria, unspecified: Secondary | ICD-10-CM | POA: Diagnosis not present

## 2023-01-10 DIAGNOSIS — I5032 Chronic diastolic (congestive) heart failure: Secondary | ICD-10-CM | POA: Diagnosis not present

## 2023-04-01 DIAGNOSIS — Z299 Encounter for prophylactic measures, unspecified: Secondary | ICD-10-CM | POA: Diagnosis not present

## 2023-04-01 DIAGNOSIS — N1832 Chronic kidney disease, stage 3b: Secondary | ICD-10-CM | POA: Diagnosis not present

## 2023-04-01 DIAGNOSIS — I10 Essential (primary) hypertension: Secondary | ICD-10-CM | POA: Diagnosis not present

## 2023-04-15 DIAGNOSIS — R809 Proteinuria, unspecified: Secondary | ICD-10-CM | POA: Diagnosis not present

## 2023-04-18 DIAGNOSIS — N2581 Secondary hyperparathyroidism of renal origin: Secondary | ICD-10-CM | POA: Diagnosis not present

## 2023-04-18 DIAGNOSIS — R809 Proteinuria, unspecified: Secondary | ICD-10-CM | POA: Diagnosis not present

## 2023-04-18 DIAGNOSIS — I5032 Chronic diastolic (congestive) heart failure: Secondary | ICD-10-CM | POA: Diagnosis not present

## 2023-04-18 DIAGNOSIS — I129 Hypertensive chronic kidney disease with stage 1 through stage 4 chronic kidney disease, or unspecified chronic kidney disease: Secondary | ICD-10-CM | POA: Diagnosis not present

## 2023-07-08 DIAGNOSIS — N189 Chronic kidney disease, unspecified: Secondary | ICD-10-CM | POA: Diagnosis not present

## 2023-07-08 DIAGNOSIS — D696 Thrombocytopenia, unspecified: Secondary | ICD-10-CM | POA: Diagnosis not present

## 2023-07-08 DIAGNOSIS — E211 Secondary hyperparathyroidism, not elsewhere classified: Secondary | ICD-10-CM | POA: Diagnosis not present

## 2023-07-08 DIAGNOSIS — D631 Anemia in chronic kidney disease: Secondary | ICD-10-CM | POA: Diagnosis not present

## 2023-07-08 DIAGNOSIS — N1832 Chronic kidney disease, stage 3b: Secondary | ICD-10-CM | POA: Diagnosis not present

## 2023-07-08 DIAGNOSIS — I10 Essential (primary) hypertension: Secondary | ICD-10-CM | POA: Diagnosis not present

## 2023-07-08 DIAGNOSIS — R809 Proteinuria, unspecified: Secondary | ICD-10-CM | POA: Diagnosis not present

## 2023-07-08 DIAGNOSIS — Z299 Encounter for prophylactic measures, unspecified: Secondary | ICD-10-CM | POA: Diagnosis not present

## 2023-07-25 DIAGNOSIS — N1832 Chronic kidney disease, stage 3b: Secondary | ICD-10-CM | POA: Diagnosis not present

## 2023-07-25 DIAGNOSIS — R809 Proteinuria, unspecified: Secondary | ICD-10-CM | POA: Diagnosis not present

## 2023-07-25 DIAGNOSIS — I5032 Chronic diastolic (congestive) heart failure: Secondary | ICD-10-CM | POA: Diagnosis not present

## 2023-07-25 DIAGNOSIS — I129 Hypertensive chronic kidney disease with stage 1 through stage 4 chronic kidney disease, or unspecified chronic kidney disease: Secondary | ICD-10-CM | POA: Diagnosis not present

## 2023-11-11 DIAGNOSIS — Z299 Encounter for prophylactic measures, unspecified: Secondary | ICD-10-CM | POA: Diagnosis not present

## 2023-11-11 DIAGNOSIS — Z79899 Other long term (current) drug therapy: Secondary | ICD-10-CM | POA: Diagnosis not present

## 2023-11-11 DIAGNOSIS — E039 Hypothyroidism, unspecified: Secondary | ICD-10-CM | POA: Diagnosis not present

## 2023-11-11 DIAGNOSIS — R5383 Other fatigue: Secondary | ICD-10-CM | POA: Diagnosis not present

## 2023-11-11 DIAGNOSIS — E7800 Pure hypercholesterolemia, unspecified: Secondary | ICD-10-CM | POA: Diagnosis not present

## 2023-11-11 DIAGNOSIS — Z0000 Encounter for general adult medical examination without abnormal findings: Secondary | ICD-10-CM | POA: Diagnosis not present

## 2023-11-28 DIAGNOSIS — D631 Anemia in chronic kidney disease: Secondary | ICD-10-CM | POA: Diagnosis not present

## 2023-11-28 DIAGNOSIS — N189 Chronic kidney disease, unspecified: Secondary | ICD-10-CM | POA: Diagnosis not present

## 2023-11-28 DIAGNOSIS — R809 Proteinuria, unspecified: Secondary | ICD-10-CM | POA: Diagnosis not present

## 2023-12-03 DIAGNOSIS — N2581 Secondary hyperparathyroidism of renal origin: Secondary | ICD-10-CM | POA: Diagnosis not present

## 2023-12-03 DIAGNOSIS — I129 Hypertensive chronic kidney disease with stage 1 through stage 4 chronic kidney disease, or unspecified chronic kidney disease: Secondary | ICD-10-CM | POA: Diagnosis not present

## 2023-12-03 DIAGNOSIS — I5032 Chronic diastolic (congestive) heart failure: Secondary | ICD-10-CM | POA: Diagnosis not present

## 2023-12-03 DIAGNOSIS — N1832 Chronic kidney disease, stage 3b: Secondary | ICD-10-CM | POA: Diagnosis not present

## 2024-01-01 DIAGNOSIS — D519 Vitamin B12 deficiency anemia, unspecified: Secondary | ICD-10-CM | POA: Diagnosis not present

## 2024-01-01 DIAGNOSIS — E538 Deficiency of other specified B group vitamins: Secondary | ICD-10-CM | POA: Diagnosis not present

## 2024-01-01 DIAGNOSIS — I6789 Other cerebrovascular disease: Secondary | ICD-10-CM | POA: Diagnosis not present

## 2024-01-01 DIAGNOSIS — M6281 Muscle weakness (generalized): Secondary | ICD-10-CM | POA: Diagnosis not present

## 2024-01-01 DIAGNOSIS — R2681 Unsteadiness on feet: Secondary | ICD-10-CM | POA: Diagnosis not present

## 2024-01-01 DIAGNOSIS — H8120 Vestibular neuronitis, unspecified ear: Secondary | ICD-10-CM | POA: Diagnosis not present

## 2024-01-01 DIAGNOSIS — R519 Headache, unspecified: Secondary | ICD-10-CM | POA: Diagnosis not present

## 2024-01-01 DIAGNOSIS — Z7902 Long term (current) use of antithrombotics/antiplatelets: Secondary | ICD-10-CM | POA: Diagnosis not present

## 2024-01-01 DIAGNOSIS — R404 Transient alteration of awareness: Secondary | ICD-10-CM | POA: Diagnosis not present

## 2024-01-01 DIAGNOSIS — E785 Hyperlipidemia, unspecified: Secondary | ICD-10-CM | POA: Diagnosis not present

## 2024-01-01 DIAGNOSIS — R930 Abnormal findings on diagnostic imaging of skull and head, not elsewhere classified: Secondary | ICD-10-CM | POA: Diagnosis not present

## 2024-01-01 DIAGNOSIS — R531 Weakness: Secondary | ICD-10-CM | POA: Diagnosis not present

## 2024-01-01 DIAGNOSIS — Z79899 Other long term (current) drug therapy: Secondary | ICD-10-CM | POA: Diagnosis not present

## 2024-01-01 DIAGNOSIS — R42 Dizziness and giddiness: Secondary | ICD-10-CM | POA: Diagnosis not present

## 2024-01-01 DIAGNOSIS — Z8673 Personal history of transient ischemic attack (TIA), and cerebral infarction without residual deficits: Secondary | ICD-10-CM | POA: Diagnosis not present

## 2024-01-01 DIAGNOSIS — R5383 Other fatigue: Secondary | ICD-10-CM | POA: Diagnosis not present

## 2024-01-01 DIAGNOSIS — R110 Nausea: Secondary | ICD-10-CM | POA: Diagnosis not present

## 2024-01-01 DIAGNOSIS — E079 Disorder of thyroid, unspecified: Secondary | ICD-10-CM | POA: Diagnosis not present

## 2024-01-01 DIAGNOSIS — I10 Essential (primary) hypertension: Secondary | ICD-10-CM | POA: Diagnosis not present

## 2024-01-02 DIAGNOSIS — R42 Dizziness and giddiness: Secondary | ICD-10-CM | POA: Diagnosis not present

## 2024-01-02 DIAGNOSIS — R531 Weakness: Secondary | ICD-10-CM | POA: Diagnosis not present

## 2024-01-06 DIAGNOSIS — N1832 Chronic kidney disease, stage 3b: Secondary | ICD-10-CM | POA: Diagnosis not present

## 2024-01-06 DIAGNOSIS — Z09 Encounter for follow-up examination after completed treatment for conditions other than malignant neoplasm: Secondary | ICD-10-CM | POA: Diagnosis not present

## 2024-01-06 DIAGNOSIS — R42 Dizziness and giddiness: Secondary | ICD-10-CM | POA: Diagnosis not present

## 2024-01-06 DIAGNOSIS — I10 Essential (primary) hypertension: Secondary | ICD-10-CM | POA: Diagnosis not present

## 2024-01-06 DIAGNOSIS — Z8673 Personal history of transient ischemic attack (TIA), and cerebral infarction without residual deficits: Secondary | ICD-10-CM | POA: Diagnosis not present

## 2024-01-06 DIAGNOSIS — Z299 Encounter for prophylactic measures, unspecified: Secondary | ICD-10-CM | POA: Diagnosis not present

## 2024-01-20 DIAGNOSIS — Z7189 Other specified counseling: Secondary | ICD-10-CM | POA: Diagnosis not present

## 2024-01-20 DIAGNOSIS — I10 Essential (primary) hypertension: Secondary | ICD-10-CM | POA: Diagnosis not present

## 2024-01-20 DIAGNOSIS — Z299 Encounter for prophylactic measures, unspecified: Secondary | ICD-10-CM | POA: Diagnosis not present

## 2024-01-20 DIAGNOSIS — R42 Dizziness and giddiness: Secondary | ICD-10-CM | POA: Diagnosis not present

## 2024-01-20 DIAGNOSIS — Z1389 Encounter for screening for other disorder: Secondary | ICD-10-CM | POA: Diagnosis not present

## 2024-01-20 DIAGNOSIS — N184 Chronic kidney disease, stage 4 (severe): Secondary | ICD-10-CM | POA: Diagnosis not present

## 2024-01-20 DIAGNOSIS — Z0000 Encounter for general adult medical examination without abnormal findings: Secondary | ICD-10-CM | POA: Diagnosis not present
